# Patient Record
Sex: Male | Born: 1940 | Race: White | Hispanic: No | Marital: Married | Smoking: Former smoker
Health system: Southern US, Community
[De-identification: ages and names within clinical notes are randomized; demographics above are authoritative.]

## PROBLEM LIST (undated history)

## (undated) DIAGNOSIS — I219 Acute myocardial infarction, unspecified: Secondary | ICD-10-CM

## (undated) DIAGNOSIS — I429 Cardiomyopathy, unspecified: Secondary | ICD-10-CM

## (undated) DIAGNOSIS — M1710 Unilateral primary osteoarthritis, unspecified knee: Secondary | ICD-10-CM

## (undated) DIAGNOSIS — E785 Hyperlipidemia, unspecified: Secondary | ICD-10-CM

## (undated) DIAGNOSIS — Z87442 Personal history of urinary calculi: Secondary | ICD-10-CM

## (undated) DIAGNOSIS — I340 Nonrheumatic mitral (valve) insufficiency: Secondary | ICD-10-CM

## (undated) DIAGNOSIS — I2510 Atherosclerotic heart disease of native coronary artery without angina pectoris: Secondary | ICD-10-CM

## (undated) DIAGNOSIS — I10 Essential (primary) hypertension: Secondary | ICD-10-CM

## (undated) DIAGNOSIS — I499 Cardiac arrhythmia, unspecified: Secondary | ICD-10-CM

## (undated) DIAGNOSIS — M1990 Unspecified osteoarthritis, unspecified site: Secondary | ICD-10-CM

## (undated) DIAGNOSIS — R7303 Prediabetes: Secondary | ICD-10-CM

## (undated) DIAGNOSIS — I4891 Unspecified atrial fibrillation: Secondary | ICD-10-CM

## (undated) HISTORY — DX: Atherosclerotic heart disease of native coronary artery without angina pectoris: I25.10

## (undated) HISTORY — PX: APPENDECTOMY: SHX54

## (undated) HISTORY — PX: ROTATOR CUFF REPAIR: SHX139

## (undated) HISTORY — PX: CATARACT EXTRACTION: SUR2

## (undated) HISTORY — PX: EYE SURGERY: SHX253

## (undated) HISTORY — PX: KNEE SURGERY: SHX244

## (undated) HISTORY — PX: CARDIAC CATHETERIZATION: SHX172

## (undated) HISTORY — DX: Nonrheumatic mitral (valve) insufficiency: I34.0

## (undated) HISTORY — PX: JOINT REPLACEMENT: SHX530

## (undated) HISTORY — DX: Hyperlipidemia, unspecified: E78.5

## (undated) HISTORY — PX: CORONARY STENT PLACEMENT: SHX1402

## (undated) HISTORY — DX: Cardiomyopathy, unspecified: I42.9

---

## 2015-06-21 DIAGNOSIS — I2510 Atherosclerotic heart disease of native coronary artery without angina pectoris: Secondary | ICD-10-CM | POA: Insufficient documentation

## 2015-06-21 DIAGNOSIS — E785 Hyperlipidemia, unspecified: Secondary | ICD-10-CM

## 2015-06-21 DIAGNOSIS — I429 Cardiomyopathy, unspecified: Secondary | ICD-10-CM | POA: Insufficient documentation

## 2015-06-21 HISTORY — DX: Atherosclerotic heart disease of native coronary artery without angina pectoris: I25.10

## 2015-06-21 HISTORY — DX: Hyperlipidemia, unspecified: E78.5

## 2016-02-06 DIAGNOSIS — N183 Chronic kidney disease, stage 3 unspecified: Secondary | ICD-10-CM | POA: Insufficient documentation

## 2016-02-06 DIAGNOSIS — I252 Old myocardial infarction: Secondary | ICD-10-CM | POA: Insufficient documentation

## 2016-02-06 DIAGNOSIS — I255 Ischemic cardiomyopathy: Secondary | ICD-10-CM | POA: Insufficient documentation

## 2016-02-06 HISTORY — DX: Old myocardial infarction: I25.2

## 2016-02-06 HISTORY — DX: Chronic kidney disease, stage 3 unspecified: N18.30

## 2016-02-06 HISTORY — DX: Ischemic cardiomyopathy: I25.5

## 2016-04-22 DIAGNOSIS — I10 Essential (primary) hypertension: Secondary | ICD-10-CM

## 2016-04-22 HISTORY — DX: Essential (primary) hypertension: I10

## 2017-01-23 DIAGNOSIS — E119 Type 2 diabetes mellitus without complications: Secondary | ICD-10-CM | POA: Insufficient documentation

## 2017-01-23 HISTORY — DX: Type 2 diabetes mellitus without complications: E11.9

## 2017-05-13 ENCOUNTER — Encounter: Payer: Self-pay | Admitting: Cardiology

## 2017-05-13 ENCOUNTER — Ambulatory Visit (INDEPENDENT_AMBULATORY_CARE_PROVIDER_SITE_OTHER): Payer: Medicare Other | Admitting: Cardiology

## 2017-05-13 VITALS — BP 130/84 | HR 71 | Ht 71.0 in | Wt 196.0 lb

## 2017-05-13 DIAGNOSIS — E785 Hyperlipidemia, unspecified: Secondary | ICD-10-CM | POA: Diagnosis not present

## 2017-05-13 DIAGNOSIS — I10 Essential (primary) hypertension: Secondary | ICD-10-CM

## 2017-05-13 DIAGNOSIS — I252 Old myocardial infarction: Secondary | ICD-10-CM

## 2017-05-13 DIAGNOSIS — Z01810 Encounter for preprocedural cardiovascular examination: Secondary | ICD-10-CM | POA: Insufficient documentation

## 2017-05-13 DIAGNOSIS — Z0000 Encounter for general adult medical examination without abnormal findings: Secondary | ICD-10-CM

## 2017-05-13 DIAGNOSIS — I255 Ischemic cardiomyopathy: Secondary | ICD-10-CM | POA: Diagnosis not present

## 2017-05-13 DIAGNOSIS — I2510 Atherosclerotic heart disease of native coronary artery without angina pectoris: Secondary | ICD-10-CM | POA: Diagnosis not present

## 2017-05-13 HISTORY — DX: Encounter for general adult medical examination without abnormal findings: Z00.00

## 2017-05-13 MED ORDER — PRAVASTATIN SODIUM 40 MG PO TABS: 40.0000 mg | ORAL_TABLET | Freq: Every evening | ORAL | 3 refills | Status: DC

## 2017-05-13 MED ORDER — RAMIPRIL 10 MG PO CAPS: 10.0000 mg | ORAL_CAPSULE | Freq: Every day | ORAL | 3 refills | Status: DC

## 2017-05-13 MED ORDER — CARVEDILOL 6.25 MG PO TABS: 6.2500 mg | ORAL_TABLET | Freq: Two times a day (BID) | ORAL | 3 refills | Status: DC

## 2017-05-13 NOTE — Addendum Note (Signed)
Addended by: Rodney Langton on: 05/13/2017 10:13 AM   Modules accepted: Orders

## 2017-05-13 NOTE — Patient Instructions (Addendum)
Medication Instructions:  Your physician recommends that you continue on your current medications as directed. Please refer to the Current Medication list given to you today.  Labwork: None   Testing/Procedures: EKG today in office.   Follow-Up: Your physician wants you to follow-up in: 6 months.  You will receive a reminder letter in the mail two months in advance. If you don't receive a letter, please call our office to schedule the follow-up appointment.  Any Other Special Instructions Will Be Listed Below (If Applicable).  Please note that any paperwork needing to be filled out by the provider will need to be addressed at the front desk prior to seeing the provider. Please note that any paperwork FMLA, Disability or other documents regarding health condition is subject to a $25.00 charge that must be received prior to completion of paperwork in the form of a money order or check.    If you need a refill on your cardiac medications before your next appointment, please call your pharmacy.

## 2017-05-13 NOTE — Progress Notes (Signed)
Cardiology Office Note:    Date:  05/13/2017   ID:  Steven Dixon, DOB 1941-04-22, MRN [id number]  PCP:  Raynelle Jan., MD  Cardiologist:  Gypsy Balsam, MD    Referring MD: Raynelle Jan., MD   Chief Complaint  Patient presents with  . Follow-up    doing well; no concerns   Doing well talking about potentially having knee replacement surgery  History of Present Illness:    Steven Dixon is a 76 y.o. male doing well denies having chest pain tightness squeezing pressure burning chest but ability to exercise is limited because of knee problem.  He is contemplating knee surgery however he wants to get one more injections before proceeding with surgery.  Denies to have any chest pain tightness but some shortness of breath with exertion I will ask him to have echocardiogram to assess left ventricular ejection fraction.  Past Medical History:  Diagnosis Date  . Cardiomyopathy (HCC)   . Coronary artery disease   . Hyperlipidemia   . MI (mitral incompetence)     Past Surgical History:  Procedure Laterality Date  . CARDIAC CATHETERIZATION    . CATARACT EXTRACTION    . CORONARY STENT PLACEMENT    . KNEE SURGERY    . ROTATOR CUFF REPAIR      Current Medications: Current Meds  Medication Sig  . aspirin EC 81 MG tablet Take 81 mg by mouth daily.  . carvedilol (COREG) 6.25 MG tablet Take 6.25 mg by mouth 2 (two) times daily.  . nitroGLYCERIN (NITROSTAT) 0.4 MG SL tablet Place 0.4 mg under the tongue as needed for chest pain.  . ramipril (ALTACE) 10 MG capsule Take 10 mg by mouth daily.  . ranolazine (RANEXA) 500 MG 12 hr tablet Take 500 mg by mouth 2 (two) times daily.     Allergies:   Patient has no known allergies.   Social History   Socioeconomic History  . Marital status: Married    Spouse name: None  . Number of children: None  . Years of education: None  . Highest education level: None  Social Needs  . Financial resource strain: None  . Food insecurity -  worry: None  . Food insecurity - inability: None  . Transportation needs - medical: None  . Transportation needs - non-medical: None  Occupational History  . None  Tobacco Use  . Smoking status: Former Games developer  . Smokeless tobacco: Never Used  Substance and Sexual Activity  . Alcohol use: No  . Drug use: No  . Sexual activity: None  Other Topics Concern  . None  Social History Narrative  . None     Family History: The patient's family history includes Heart disease in his father and mother; Rheum arthritis in his father. ROS:   Please see the history of present illness.    All 14 point review of systems negative except as described per history of present illness  EKGs/Labs/Other Studies Reviewed:      Recent Labs: No results found for requested labs within last 8760 hours.  Recent Lipid Panel No results found for: CHOL, TRIG, HDL, CHOLHDL, VLDL, LDLCALC, LDLDIRECT  Physical Exam:    VS:  BP 130/84 (BP Location: Right Arm, Patient Position: Sitting, Cuff Size: Normal)   Pulse 71   Ht 5\' 11"  (1.803 m)   Wt 196 lb 0.6 oz (88.9 kg)   BMI 27.34 kg/m     Wt Readings from Last 3 Encounters:  05/13/17 196 lb 0.6  oz (88.9 kg)     GEN:  Well nourished, well developed in no acute distress HEENT: Normal NECK: No JVD; No carotid bruits LYMPHATICS: No lymphadenopathy CARDIAC: RRR, no murmurs, no rubs, no gallops RESPIRATORY:  Clear to auscultation without rales, wheezing or rhonchi  ABDOMEN: Soft, non-tender, non-distended MUSCULOSKELETAL:  No edema; No deformity  SKIN: Warm and dry LOWER EXTREMITIES: no swelling NEUROLOGIC:  Alert and oriented x 3 PSYCHIATRIC:  Normal affect   ASSESSMENT:     PLAN:    In order of problems listed above:  1. 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 Dyslipidemia he had some issues with statin he read a lot about  the bad effect of this medication I convince him to go back on pravastatin also told him ideally he need to be on high intensity statin.  He wants to try pravastatin first 3. Essential hypertension: Blood pressure well controlled. 4. Potential need for knee surgery.  He will let us know if he decides to proceed that way then will do a stress test.   Medication Adjustments/Labs and Tests Ordered: Current medicines are reviewed at length with the patient today.  Concerns regarding medicines are outlined above.  Orders Placed This Encounter  Procedures  . EKG 12-Lead   Medication changes: No orders of the defined types were placed in this encounter.   Signed, Georgeanna Leaobert J. Krasowski, MD, [facility] 05/13/2017 10:05 AM    [facility]

## 2017-07-09 ENCOUNTER — Other Ambulatory Visit: Payer: Self-pay | Admitting: Nurse Practitioner

## 2017-07-09 ENCOUNTER — Telehealth: Payer: Self-pay

## 2017-07-09 DIAGNOSIS — I509 Heart failure, unspecified: Secondary | ICD-10-CM

## 2017-07-09 DIAGNOSIS — Z01810 Encounter for preprocedural cardiovascular examination: Principal | ICD-10-CM

## 2017-07-09 NOTE — Telephone Encounter (Signed)
   Primary Cardiologist:Steven Bing Matter, MD  Chart reviewed as part of pre-operative protocol coverage. Because of [place] past medical history and information in his most recent clinic visit, [name] will require a lexiscan myoview prior to knee surgery.  I will place order for this test and have staff contact patient to schedule.  Pre-op covering staff: - Please schedule appointment and call patient to inform them.  Nicolasa Ducking, NP  07/09/2017, 3:19 PM

## 2017-07-09 NOTE — Telephone Encounter (Signed)
   [facility] Pre-operative Risk Assessment    Request for surgical clearance:  1. What type of surgery is being performed? Right total knee replacement.  2. When is this surgery scheduled? 08/14/2017   3. Are there any medications that need to be held prior to surgery and how long? Please advise as to any special instructions before, during and after surgery for patient.   4. Practice name and name of physician performing surgery? [facility]. Dr. French Ana  5. What is your office phone and fax number? Phone: [phone]. Fax: [phone]. ATTNClaiborne Billings  6. Anesthesia type (None, local, MAC, general) ? Not specified.   Steven Dixon S 07/09/2017, 2:21 PM  _________________________________________________________________   (provider comments below)

## 2017-07-10 ENCOUNTER — Telehealth (HOSPITAL_COMMUNITY): Payer: Self-pay | Admitting: *Deleted

## 2017-07-10 NOTE — Telephone Encounter (Signed)
Left message on voicemail per DPR in reference to upcoming appointment scheduled on 07/14/17 at 10:00 with detailed instructions given per Myocardial Perfusion Study Information Sheet for the test. LM to arrive 15 minutes early, and that it is imperative to arrive on time for appointment to keep from having the test rescheduled. If you need to cancel or reschedule your appointment, please call the office within 24 hours of your appointment. Failure to do so may result in a cancellation of your appointment, and a $50 no show fee. Phone number given for call back for any questions. Sharon S Brooks ° ° ° °

## 2017-07-13 ENCOUNTER — Telehealth: Payer: Self-pay | Admitting: Cardiology

## 2017-07-13 NOTE — Telephone Encounter (Signed)
Patient is having knee surgery and was just seen recently and wants to know if he needs to come in again for clearance.Marland Kitchen

## 2017-07-13 NOTE — Telephone Encounter (Signed)
S/w pt at extreme length regarding his need for a lexiscan for cardiac clearance. Pt had got his appointments confused with pre op stuff and his lexiscan. This has now been clarified with the pt and he is aware that he is to arrive at [address] tomorrow for his lexiscan needed for clearance. He was advised that should this test come back abnormal he would then need to see the provider for further evaluation before surgery. Pt verbalized understanding and is aware of his arrival time for the appointment tomorrow. Instructions regarding the test were went over again as well.

## 2017-07-13 NOTE — Telephone Encounter (Signed)
Please advise 

## 2017-07-14 ENCOUNTER — Ambulatory Visit (HOSPITAL_COMMUNITY): Payer: Medicare Other | Attending: Cardiology

## 2017-07-14 DIAGNOSIS — Z01810 Encounter for preprocedural cardiovascular examination: Secondary | ICD-10-CM | POA: Diagnosis present

## 2017-07-14 DIAGNOSIS — I509 Heart failure, unspecified: Secondary | ICD-10-CM

## 2017-07-14 LAB — MYOCARDIAL PERFUSION IMAGING
CHL CUP NUCLEAR SDS: 0
CHL CUP NUCLEAR SRS: 15
CHL CUP RESTING HR STRESS: 63 {beats}/min
LV dias vol: 126 mL (ref 62–150)
LV sys vol: 74 mL
Peak HR: 81 {beats}/min
RATE: 0.33
SSS: 15
TID: 0.98

## 2017-07-14 MED ORDER — TECHNETIUM TC 99M TETROFOSMIN IV KIT
11.0000 | PACK | Freq: Once | INTRAVENOUS | Status: AC | PRN
  Administered 2017-07-14: 11 via INTRAVENOUS
  Filled 2017-07-14: qty 11

## 2017-07-14 MED ORDER — REGADENOSON 0.4 MG/5ML IV SOLN
0.4000 mg | Freq: Once | INTRAVENOUS | Status: AC
  Administered 2017-07-14: 0.4 mg via INTRAVENOUS

## 2017-07-14 MED ORDER — TECHNETIUM TC 99M TETROFOSMIN IV KIT
32.9000 | PACK | Freq: Once | INTRAVENOUS | Status: AC | PRN
  Administered 2017-07-14: 32.9 via INTRAVENOUS
  Filled 2017-07-14: qty 33

## 2017-07-16 ENCOUNTER — Ambulatory Visit (HOSPITAL_COMMUNITY): Payer: Medicare Other | Attending: Cardiovascular Disease

## 2017-07-16 ENCOUNTER — Other Ambulatory Visit: Payer: Self-pay

## 2017-07-16 DIAGNOSIS — I10 Essential (primary) hypertension: Secondary | ICD-10-CM | POA: Insufficient documentation

## 2017-07-16 DIAGNOSIS — I2510 Atherosclerotic heart disease of native coronary artery without angina pectoris: Secondary | ICD-10-CM | POA: Insufficient documentation

## 2017-07-16 DIAGNOSIS — E785 Hyperlipidemia, unspecified: Secondary | ICD-10-CM | POA: Insufficient documentation

## 2017-07-16 DIAGNOSIS — I255 Ischemic cardiomyopathy: Secondary | ICD-10-CM | POA: Insufficient documentation

## 2017-07-16 LAB — ECHOCARDIOGRAM COMPLETE
Ao-asc: 33 cm
CHL CUP DOP CALC LVOT VTI: 14.8 cm
CHL CUP MV DEC (S): 352
E/e' ratio: 15.61
EWDT: 352 ms
FS: 37 % (ref 28–44)
IVS/LV PW RATIO, ED: 0.69
LA vol A4C: 20.8 ml
LA vol: 22.5 mL
LADIAMINDEX: 1.67 cm/m2
LASIZE: 35 mm
LAVOLIN: 10.8 mL/m2
LDCA: 4.15 cm2
LEFT ATRIUM END SYS DIAM: 35 mm
LV E/e'average: 15.61
LV PW d: 11.6 mm — AB (ref 0.6–1.1)
LV TDI E'LATERAL: 3.26
LVEEMED: 15.61
LVELAT: 3.26 cm/s
LVOT SV: 61 mL
LVOT diameter: 23 mm
LVOTPV: 70.2 cm/s
MV pk A vel: 83.9 m/s
MV pk E vel: 50.9 m/s
RV LATERAL S' VELOCITY: 10.7 cm/s
TAPSE: 21.9 mm
TDI e' medial: 3.15

## 2017-07-22 ENCOUNTER — Ambulatory Visit: Payer: Self-pay | Admitting: Physician Assistant

## 2017-07-22 NOTE — H&P (Signed)
TOTAL KNEE ADMISSION H&P  Patient is being admitted for right total knee arthroplasty.  Subjective:  Chief Complaint:right knee pain.  HPI: Steven Dixon, 77 y.o. male, has a history of pain and functional disability in the right knee due to arthritis and has failed non-surgical conservative treatments for greater than 12 weeks to includeNSAID's and/or analgesics, corticosteriod injections, use of assistive devices and activity modification.  Onset of symptoms was gradual, starting 8 years ago with gradually worsening course since that time. The patient noted no past surgery on the right knee(s).  Patient currently rates pain in the right knee(s) at 10 out of 10 with activity. Patient has night pain, worsening of pain with activity and weight bearing, pain that interferes with activities of daily living, pain with passive range of motion, crepitus and joint swelling.  Patient has evidence of periarticular osteophytes and joint space narrowing by imaging studies.  There is no active infection.  Patient Active Problem List   Diagnosis Date Noted  . Diabetes mellitus type II, controlled (HCC) 01/23/2017  . Essential hypertension 04/22/2016  . CKD (chronic kidney disease), stage III (HCC) 02/06/2016  . Old MI (myocardial infarction) 02/06/2016  . Cardiomyopathy (HCC) 06/21/2015  . Coronary artery disease involving native coronary artery of native heart without angina pectoris 06/21/2015  . Dyslipidemia 06/21/2015   Past Medical History:  Diagnosis Date  . Cardiomyopathy (HCC)   . Coronary artery disease   . Hyperlipidemia   . MI (mitral incompetence)     Past Surgical History:  Procedure Laterality Date  . CARDIAC CATHETERIZATION    . CATARACT EXTRACTION    . CORONARY STENT PLACEMENT    . KNEE SURGERY    . ROTATOR CUFF REPAIR      Current Outpatient Medications  Medication Sig Dispense Refill Last Dose  . aspirin EC 81 MG tablet Take 81 mg by mouth daily.   Taking  . carvedilol  (COREG) 6.25 MG tablet Take 1 tablet (6.25 mg total) 2 (two) times daily by mouth. 180 tablet 3   . nitroGLYCERIN (NITROSTAT) 0.4 MG SL tablet Place 0.4 mg under the tongue as needed for chest pain.   Taking  . pravastatin (PRAVACHOL) 40 MG tablet Take 1 tablet (40 mg total) every evening by mouth. 90 tablet 3   . ramipril (ALTACE) 10 MG capsule Take 1 capsule (10 mg total) daily by mouth. 90 capsule 3   . ranolazine (RANEXA) 500 MG 12 hr tablet Take 500 mg by mouth 2 (two) times daily.   Taking   No current facility-administered medications for this visit.    No Known Allergies  Social History   Tobacco Use  . Smoking status: Former Games developer  . Smokeless tobacco: Never Used  Substance Use Topics  . Alcohol use: No    Family History  Problem Relation Age of Onset  . Heart disease Mother   . Heart disease Father   . Rheum arthritis Father      Review of Systems  Genitourinary: Positive for frequency.  Musculoskeletal: Positive for joint pain.  Endo/Heme/Allergies: Bruises/bleeds easily.  All other systems reviewed and are negative.   Objective:  Physical Exam  Constitutional: He is oriented to person, place, and time. He appears well-developed and well-nourished. No distress.  HENT:  Head: Normocephalic and atraumatic.  Nose: Nose normal.  Eyes: Conjunctivae and EOM are normal. Pupils are equal, round, and reactive to light.  Neck: Normal range of motion. Neck supple.  Cardiovascular: Normal rate, regular rhythm, normal  heart sounds and intact distal pulses.  Respiratory: Breath sounds normal. No respiratory distress. He has no wheezes.  GI: Soft. Bowel sounds are normal. He exhibits no distension. There is no tenderness.  Musculoskeletal:       Right knee: He exhibits decreased range of motion and swelling. Tenderness found.  Lymphadenopathy:    He has no cervical adenopathy.  Neurological: He is alert and oriented to person, place, and time. No cranial nerve deficit.   Skin: Skin is warm and dry. No rash noted. No erythema.  Psychiatric: He has a normal mood and affect. His behavior is normal.    Vital signs in last 24 hours: @VSRANGES @  Labs:   Estimated body mass index is 27.34 kg/m as calculated from the following:   Height as of 07/14/17: 5\' 11"  (1.803 m).   Weight as of 07/14/17: 88.9 kg (196 lb).   Imaging Review Plain radiographs demonstrate moderate degenerative joint disease of the right knee(s). The overall alignment issignificant varus. The bone quality appears to be good for age and reported activity level.  Assessment/Plan:  End stage arthritis, right knee   The patient history, physical examination, clinical judgment of the provider and imaging studies are consistent with end stage degenerative joint disease of the right knee(s) and total knee arthroplasty is deemed medically necessary. The treatment options including medical management, injection therapy arthroscopy and arthroplasty were discussed at length. The risks and benefits of total knee arthroplasty were presented and reviewed. The risks due to aseptic loosening, infection, stiffness, patella tracking problems, thromboembolic complications and other imponderables were discussed. The patient acknowledged the explanation, agreed to proceed with the plan and consent was signed. Patient is being admitted for inpatient treatment for surgery, pain control, PT, OT, prophylactic antibiotics, VTE prophylaxis, progressive ambulation and ADL's and discharge planning. The patient is planning to be discharged home with home health services

## 2017-07-22 NOTE — H&P (View-Only) (Signed)
TOTAL KNEE ADMISSION H&P  Patient is being admitted for right total knee arthroplasty.  Subjective:  Chief Complaint:right knee pain.  HPI: Steven Dixon, 77 y.o. male, has a history of pain and functional disability in the right knee due to arthritis and has failed non-surgical conservative treatments for greater than 12 weeks to includeNSAID's and/or analgesics, corticosteriod injections, use of assistive devices and activity modification.  Onset of symptoms was gradual, starting 8 years ago with gradually worsening course since that time. The patient noted no past surgery on the right knee(s).  Patient currently rates pain in the right knee(s) at 10 out of 10 with activity. Patient has night pain, worsening of pain with activity and weight bearing, pain that interferes with activities of daily living, pain with passive range of motion, crepitus and joint swelling.  Patient has evidence of periarticular osteophytes and joint space narrowing by imaging studies.  There is no active infection.  Patient Active Problem List   Diagnosis Date Noted  . Diabetes mellitus type II, controlled (HCC) 01/23/2017  . Essential hypertension 04/22/2016  . CKD (chronic kidney disease), stage III (HCC) 02/06/2016  . Old MI (myocardial infarction) 02/06/2016  . Cardiomyopathy (HCC) 06/21/2015  . Coronary artery disease involving native coronary artery of native heart without angina pectoris 06/21/2015  . Dyslipidemia 06/21/2015   Past Medical History:  Diagnosis Date  . Cardiomyopathy (HCC)   . Coronary artery disease   . Hyperlipidemia   . MI (mitral incompetence)     Past Surgical History:  Procedure Laterality Date  . CARDIAC CATHETERIZATION    . CATARACT EXTRACTION    . CORONARY STENT PLACEMENT    . KNEE SURGERY    . ROTATOR CUFF REPAIR      Current Outpatient Medications  Medication Sig Dispense Refill Last Dose  . aspirin EC 81 MG tablet Take 81 mg by mouth daily.   Taking  . carvedilol  (COREG) 6.25 MG tablet Take 1 tablet (6.25 mg total) 2 (two) times daily by mouth. 180 tablet 3   . nitroGLYCERIN (NITROSTAT) 0.4 MG SL tablet Place 0.4 mg under the tongue as needed for chest pain.   Taking  . pravastatin (PRAVACHOL) 40 MG tablet Take 1 tablet (40 mg total) every evening by mouth. 90 tablet 3   . ramipril (ALTACE) 10 MG capsule Take 1 capsule (10 mg total) daily by mouth. 90 capsule 3   . ranolazine (RANEXA) 500 MG 12 hr tablet Take 500 mg by mouth 2 (two) times daily.   Taking   No current facility-administered medications for this visit.    No Known Allergies  Social History   Tobacco Use  . Smoking status: Former Games developer  . Smokeless tobacco: Never Used  Substance Use Topics  . Alcohol use: No    Family History  Problem Relation Age of Onset  . Heart disease Mother   . Heart disease Father   . Rheum arthritis Father      Review of Systems  Genitourinary: Positive for frequency.  Musculoskeletal: Positive for joint pain.  Endo/Heme/Allergies: Bruises/bleeds easily.  All other systems reviewed and are negative.   Objective:  Physical Exam  Constitutional: He is oriented to person, place, and time. He appears well-developed and well-nourished. No distress.  HENT:  Head: Normocephalic and atraumatic.  Nose: Nose normal.  Eyes: Conjunctivae and EOM are normal. Pupils are equal, round, and reactive to light.  Neck: Normal range of motion. Neck supple.  Cardiovascular: Normal rate, regular rhythm, normal  heart sounds and intact distal pulses.  Respiratory: Breath sounds normal. No respiratory distress. He has no wheezes.  GI: Soft. Bowel sounds are normal. He exhibits no distension. There is no tenderness.  Musculoskeletal:       Right knee: He exhibits decreased range of motion and swelling. Tenderness found.  Lymphadenopathy:    He has no cervical adenopathy.  Neurological: He is alert and oriented to person, place, and time. No cranial nerve deficit.   Skin: Skin is warm and dry. No rash noted. No erythema.  Psychiatric: He has a normal mood and affect. His behavior is normal.    Vital signs in last 24 hours: @VSRANGES @  Labs:   Estimated body mass index is 27.34 kg/m as calculated from the following:   Height as of 07/14/17: 5\' 11"  (1.803 m).   Weight as of 07/14/17: 88.9 kg (196 lb).   Imaging Review Plain radiographs demonstrate moderate degenerative joint disease of the right knee(s). The overall alignment issignificant varus. The bone quality appears to be good for age and reported activity level.  Assessment/Plan:  End stage arthritis, right knee   The patient history, physical examination, clinical judgment of the provider and imaging studies are consistent with end stage degenerative joint disease of the right knee(s) and total knee arthroplasty is deemed medically necessary. The treatment options including medical management, injection therapy arthroscopy and arthroplasty were discussed at length. The risks and benefits of total knee arthroplasty were presented and reviewed. The risks due to aseptic loosening, infection, stiffness, patella tracking problems, thromboembolic complications and other imponderables were discussed. The patient acknowledged the explanation, agreed to proceed with the plan and consent was signed. Patient is being admitted for inpatient treatment for surgery, pain control, PT, OT, prophylactic antibiotics, VTE prophylaxis, progressive ambulation and ADL's and discharge planning. The patient is planning to be discharged home with home health services

## 2017-07-31 NOTE — Pre-Procedure Instructions (Signed)
Steven Dixon  07/31/2017      [facility] - [place] - [place], [place] - [address] [place] [address] Phone: [phone] Fax: [phone]    Your procedure is scheduled on Fri. Feb. 8  Report to [facility] Admitting at 8:30 A.M.  Call this number if you have problems the morning of surgery:  [phone]   Remember:  Do not eat food or drink liquids after midnight on Thurs. Feb.2   Take these medicines the morning of surgery with A SIP OF WATER : carvedilol (coreg), hydrocodone if needed,nitroglycerine if needed               7 days prior to surgery STOP taking any Aspirin(unless otherwise instructed by your surgeon), Aleve, Naproxen, Ibuprofen, Motrin, Advil, Goody's, BC's, all herbal medications, fish oil, and all vitamins   Do not wear jewelry.  Do not wear lotions, powders, or perfumes, or deodorant.  Do not shave 48 hours prior to surgery.  Men may shave face and neck.  Do not bring valuables to the hospital.  [facility] is not responsible for any belongings or valuables.  Contacts, dentures or bridgework may not be worn into surgery.  Leave your suitcase in the car.  After surgery it may be brought to your room.  For patients admitted to the hospital, discharge time will be determined by your treatment team.  Patients discharged the day of surgery will not be allowed to drive home.    Special instructions:  [place]- Preparing For Surgery  Before surgery, you can play an important role. Because skin is not sterile, your skin needs to be as free of germs as possible. You can reduce the number of germs on your skin by washing with CHG (chlorahexidine gluconate) Soap before surgery.  CHG is an antiseptic cleaner which kills germs and bonds with the skin to continue killing germs even after washing.  Please do not use if you have an allergy to CHG or antibacterial soaps. If your skin becomes reddened/irritated stop using the CHG.   Do not shave (including legs and underarms) for at least 48 hours prior to first CHG shower. It is OK to shave your face.  Please follow these instructions carefully.   1. Shower the NIGHT BEFORE SURGERY and the MORNING OF SURGERY with CHG.   2. If you chose to wash your hair, wash your hair first as usual with your normal shampoo.  3. After you shampoo, rinse your hair and body thoroughly to remove the shampoo.  4. Use CHG as you would any other liquid soap. You can apply CHG directly to the skin and wash gently with a scrungie or a clean washcloth.   5. Apply the CHG Soap to your body ONLY FROM THE NECK DOWN.  Do not use on open wounds or open sores. Avoid contact with your eyes, ears, mouth and genitals (private parts). Wash Face and genitals (private parts)  with your normal soap.  6. Wash thoroughly, paying special attention to the area where your surgery will be performed.  7. Thoroughly rinse your body with warm water from the neck down.  8. DO NOT shower/wash with your normal soap after using and rinsing off the CHG Soap.  9. Pat yourself dry with a CLEAN TOWEL.  10. Wear CLEAN PAJAMAS to bed the night before surgery, wear comfortable clothes the morning of surgery  11. Place CLEAN SHEETS on your bed the  night of your first shower and DO NOT SLEEP WITH PETS.    Day of Surgery: Do not apply any deodorants/lotions. Please wear clean clothes to the hospital/surgery center.      Please read over the following fact sheets that you were given. Coughing and Deep Breathing, MRSA Information and Surgical Site Infection Prevention

## 2017-08-03 ENCOUNTER — Ambulatory Visit (HOSPITAL_COMMUNITY)
Admission: RE | Admit: 2017-08-03 | Discharge: 2017-08-03 | Disposition: A | Discharge status: Home / Self Care | Payer: Medicare Other | Source: Ambulatory Visit | Attending: Physician Assistant | Admitting: Physician Assistant

## 2017-08-03 ENCOUNTER — Encounter (HOSPITAL_COMMUNITY)
Admission: RE | Admit: 2017-08-03 | Discharge: 2017-08-03 | Disposition: A | Discharge status: Home / Self Care | Payer: Medicare Other | Source: Ambulatory Visit | Attending: Orthopedic Surgery | Admitting: Orthopedic Surgery

## 2017-08-03 ENCOUNTER — Other Ambulatory Visit: Payer: Self-pay

## 2017-08-03 ENCOUNTER — Encounter (HOSPITAL_COMMUNITY): Payer: Self-pay

## 2017-08-03 DIAGNOSIS — E785 Hyperlipidemia, unspecified: Secondary | ICD-10-CM | POA: Insufficient documentation

## 2017-08-03 DIAGNOSIS — Z0183 Encounter for blood typing: Secondary | ICD-10-CM | POA: Diagnosis not present

## 2017-08-03 DIAGNOSIS — R7303 Prediabetes: Secondary | ICD-10-CM | POA: Insufficient documentation

## 2017-08-03 DIAGNOSIS — Z01812 Encounter for preprocedural laboratory examination: Secondary | ICD-10-CM | POA: Insufficient documentation

## 2017-08-03 DIAGNOSIS — I2510 Atherosclerotic heart disease of native coronary artery without angina pectoris: Secondary | ICD-10-CM | POA: Diagnosis not present

## 2017-08-03 DIAGNOSIS — I255 Ischemic cardiomyopathy: Secondary | ICD-10-CM | POA: Diagnosis not present

## 2017-08-03 DIAGNOSIS — Z01818 Encounter for other preprocedural examination: Secondary | ICD-10-CM | POA: Insufficient documentation

## 2017-08-03 DIAGNOSIS — Z7982 Long term (current) use of aspirin: Secondary | ICD-10-CM | POA: Diagnosis not present

## 2017-08-03 DIAGNOSIS — Z79899 Other long term (current) drug therapy: Secondary | ICD-10-CM | POA: Insufficient documentation

## 2017-08-03 DIAGNOSIS — M1711 Unilateral primary osteoarthritis, right knee: Secondary | ICD-10-CM

## 2017-08-03 DIAGNOSIS — I10 Essential (primary) hypertension: Secondary | ICD-10-CM | POA: Insufficient documentation

## 2017-08-03 DIAGNOSIS — Z87891 Personal history of nicotine dependence: Secondary | ICD-10-CM | POA: Insufficient documentation

## 2017-08-03 DIAGNOSIS — Z955 Presence of coronary angioplasty implant and graft: Secondary | ICD-10-CM | POA: Diagnosis not present

## 2017-08-03 HISTORY — DX: Prediabetes: R73.03

## 2017-08-03 HISTORY — DX: Unilateral primary osteoarthritis, unspecified knee: M17.10

## 2017-08-03 HISTORY — DX: Unspecified osteoarthritis, unspecified site: M19.90

## 2017-08-03 HISTORY — DX: Acute myocardial infarction, unspecified: I21.9

## 2017-08-03 HISTORY — DX: Essential (primary) hypertension: I10

## 2017-08-03 LAB — URINALYSIS, ROUTINE W REFLEX MICROSCOPIC
Bilirubin Urine: NEGATIVE
Glucose, UA: NEGATIVE mg/dL
Hgb urine dipstick: NEGATIVE
Ketones, ur: NEGATIVE mg/dL
LEUKOCYTES UA: NEGATIVE
NITRITE: NEGATIVE
Protein, ur: NEGATIVE mg/dL
SPECIFIC GRAVITY, URINE: 1.024 (ref 1.005–1.030)
pH: 5 (ref 5.0–8.0)

## 2017-08-03 LAB — COMPREHENSIVE METABOLIC PANEL
ALBUMIN: 4.5 g/dL (ref 3.5–5.0)
ALK PHOS: 57 U/L (ref 38–126)
ALT: 16 U/L — AB (ref 17–63)
ANION GAP: 12 (ref 5–15)
AST: 21 U/L (ref 15–41)
BILIRUBIN TOTAL: 0.8 mg/dL (ref 0.3–1.2)
BUN: 19 mg/dL (ref 6–20)
CALCIUM: 9.6 mg/dL (ref 8.9–10.3)
CO2: 23 mmol/L (ref 22–32)
CREATININE: 1.45 mg/dL — AB (ref 0.61–1.24)
Chloride: 102 mmol/L (ref 101–111)
GFR calc Af Amer: 53 mL/min — ABNORMAL LOW (ref >60)
GFR calc non Af Amer: 46 mL/min — ABNORMAL LOW (ref >60)
GLUCOSE: 144 mg/dL — AB (ref 65–99)
Potassium: 3.9 mmol/L (ref 3.5–5.1)
Sodium: 137 mmol/L (ref 135–145)
TOTAL PROTEIN: 7.6 g/dL (ref 6.5–8.1)

## 2017-08-03 LAB — CBC WITH DIFFERENTIAL/PLATELET
BASOS PCT: 1 %
Basophils Absolute: 0 10*3/uL (ref 0.0–0.1)
Eosinophils Absolute: 0.6 10*3/uL (ref 0.0–0.7)
Eosinophils Relative: 7 %
HCT: 47.4 % (ref 39.0–52.0)
HEMOGLOBIN: 16.2 g/dL (ref 13.0–17.0)
LYMPHS ABS: 1.8 10*3/uL (ref 0.7–4.0)
Lymphocytes Relative: 22 %
MCH: 30.3 pg (ref 26.0–34.0)
MCHC: 34.2 g/dL (ref 30.0–36.0)
MCV: 88.6 fL (ref 78.0–100.0)
MONOS PCT: 7 %
Monocytes Absolute: 0.6 10*3/uL (ref 0.1–1.0)
NEUTROS ABS: 5.3 10*3/uL (ref 1.7–7.7)
NEUTROS PCT: 63 %
Platelets: 303 10*3/uL (ref 150–400)
RBC: 5.35 MIL/uL (ref 4.22–5.81)
RDW: 13.4 % (ref 11.5–15.5)
WBC: 8.2 10*3/uL (ref 4.0–10.5)

## 2017-08-03 LAB — TYPE AND SCREEN
ABO/RH(D): O POS
Antibody Screen: NEGATIVE

## 2017-08-03 LAB — SURGICAL PCR SCREEN
MRSA, PCR: NEGATIVE
STAPHYLOCOCCUS AUREUS: NEGATIVE

## 2017-08-03 LAB — APTT: aPTT: 36 seconds (ref 24–36)

## 2017-08-03 LAB — PROTIME-INR
INR: 0.99
Prothrombin Time: 13 seconds (ref 11.4–15.2)

## 2017-08-03 LAB — ABO/RH: ABO/RH(D): O POS

## 2017-08-03 NOTE — Progress Notes (Signed)
PCP - Dr. Carolyne Fiscal  Cardiologist - Dr. Bing Matter- Clearance Note under Echo results  Chest x-ray - 08/03/17  EKG - 05/13/17 (E)  Stress Test - 07/14/17 (E)  ECHO - 07/16/17 (E)  Cardiac Cath - Can't remember the date, but has Stents x2  Sleep Study - No CPAP - None  LABS- 08/03/17: CBC w/D, CMP, PT, PTT, UA, UC, T/S  HA1C- 05/13/17: 6.9 (CE) Borderline- No meds  Anesthesia- Yes. History and cardiac clearance note  Pt denies having chest pain, sob, or fever at this time. All instructions explained to the pt, with a verbal understanding of the material. Pt agrees to go over the instructions while at home for a better understanding. The opportunity to ask questions was provided.

## 2017-08-04 LAB — URINE CULTURE: CULTURE: NO GROWTH

## 2017-08-04 NOTE — Progress Notes (Signed)
Anesthesia Chart Review:  Pt is a 77 year old male scheduled for R total knee arthroplasty on 08/14/2017 with Frederico Hamman, MD  - PCP is Joetta Manners, MD (notes in care everywhere)  - Cardiologist is Elenora Fender who cleared pt for surgery in comment on echo 07/16/17  PMH includes:  CAD ("stents x2" per pt; can't remember when cath was), ischemic cardiomyopathy, HTN, pre-diabetes, hyperlipidemia. Former smoker. BMI 26.5  Medications include: ASA 81 mg, carvedilol, pravastatin, ramipril  BP (!) 152/74   Pulse 68   Temp 36.8 C   Resp 20   Ht 5\' 11"  (1.803 m)   Wt 190 lb 9.6 oz (86.5 kg)   SpO2 98%   BMI 26.58 kg/m   Preoperative labs reviewed.    CXR 08/03/17: Negative two view chest x-ray  EKG 05/13/17: Sinus bradycardia (59 bpm). Anteroseptal infarct, age undetermined.   Echo 07/16/17:  - Left ventricle: Global longitudinal LV strain is abnormal at-12% The cavity size was mildly dilated. Systolic function was mildly to moderately reduced. The estimated ejection fraction was in the range of 40% to 45%. There is akinesis of the apicalanterior, lateral, inferior, and apical myocardium. There is akinesis of the midanteroseptal myocardium. There was an increased relative contribution of atrial contraction to ventricular filling. Doppler parameters are consistent with abnormal left ventricular relaxation (grade 1 diastolic dysfunction). Doppler parameters are consistent with high ventricular filling pressure. - Aortic valve: Trileaflet; mildly thickened, mildly calcified leaflets. There was trivial regurgitation. - Mitral valve: There was trivial regurgitation. - Pulmonary arteries: Systolic pressure could not be accurately estimated.  Nuclear stress test 07/14/17:   Nuclear stress EF: 41%.  There was no ST segment deviation noted during stress.  Defect 1: There is a medium defect of severe severity present in the mid anterior, apical anterior and apex location.  This is an  intermediate risk study.  The left ventricular ejection fraction is moderately decreased (30-44%).  Findings consistent with prior myocardial infarction. - There is a medium size, severe, non-reversible defect in the mid and apical anterior walls and in the true apex consistent with an infarct in the mid LAD territory with no peri-infarct ischemia.   If no changes, I anticipate pt can proceed with surgery as scheduled.   Rica Mast, FNP-BC [facility]/Anesthesiology Phone: [phone] 08/04/2017 12:41 PM

## 2017-08-06 ENCOUNTER — Other Ambulatory Visit: Payer: Self-pay | Admitting: Orthopedic Surgery

## 2017-08-13 MED ORDER — TRANEXAMIC ACID 1000 MG/10ML IV SOLN
2000.0000 mg | INTRAVENOUS | Status: AC
  Administered 2017-08-14: 2000 mg via TOPICAL
  Filled 2017-08-13: qty 20

## 2017-08-13 MED ORDER — TRANEXAMIC ACID 1000 MG/10ML IV SOLN
1000.0000 mg | INTRAVENOUS | Status: AC
  Administered 2017-08-14: 1000 mg via INTRAVENOUS
  Filled 2017-08-13: qty 1100

## 2017-08-13 MED ORDER — BUPIVACAINE LIPOSOME 1.3 % IJ SUSP
20.0000 mL | INTRAMUSCULAR | Status: AC
  Administered 2017-08-14: 20 mL
  Filled 2017-08-13: qty 20

## 2017-08-14 ENCOUNTER — Inpatient Hospital Stay (HOSPITAL_COMMUNITY): Payer: Medicare Other | Admitting: Emergency Medicine

## 2017-08-14 ENCOUNTER — Other Ambulatory Visit: Payer: Self-pay

## 2017-08-14 ENCOUNTER — Inpatient Hospital Stay (HOSPITAL_COMMUNITY): Payer: Medicare Other | Admitting: Anesthesiology

## 2017-08-14 ENCOUNTER — Encounter (HOSPITAL_COMMUNITY): Payer: Self-pay | Admitting: Anesthesiology

## 2017-08-14 ENCOUNTER — Encounter (HOSPITAL_COMMUNITY)
Admission: RE | Disposition: A | Discharge status: Home Health | Payer: Self-pay | Source: Ambulatory Visit | Attending: Orthopedic Surgery

## 2017-08-14 ENCOUNTER — Inpatient Hospital Stay (HOSPITAL_COMMUNITY)
Admission: RE | Admit: 2017-08-14 | Discharge: 2017-08-16 | DRG: 470 | Disposition: A | Discharge status: Home Health | Payer: Medicare Other | Source: Ambulatory Visit | Attending: Orthopedic Surgery | Admitting: Orthopedic Surgery

## 2017-08-14 DIAGNOSIS — I340 Nonrheumatic mitral (valve) insufficiency: Secondary | ICD-10-CM | POA: Diagnosis present

## 2017-08-14 DIAGNOSIS — N183 Chronic kidney disease, stage 3 (moderate): Secondary | ICD-10-CM | POA: Diagnosis present

## 2017-08-14 DIAGNOSIS — Z7982 Long term (current) use of aspirin: Secondary | ICD-10-CM

## 2017-08-14 DIAGNOSIS — Z955 Presence of coronary angioplasty implant and graft: Secondary | ICD-10-CM | POA: Diagnosis not present

## 2017-08-14 DIAGNOSIS — Z8249 Family history of ischemic heart disease and other diseases of the circulatory system: Secondary | ICD-10-CM

## 2017-08-14 DIAGNOSIS — Z8261 Family history of arthritis: Secondary | ICD-10-CM | POA: Diagnosis not present

## 2017-08-14 DIAGNOSIS — Z79899 Other long term (current) drug therapy: Secondary | ICD-10-CM

## 2017-08-14 DIAGNOSIS — M1711 Unilateral primary osteoarthritis, right knee: Secondary | ICD-10-CM | POA: Diagnosis not present

## 2017-08-14 DIAGNOSIS — Z87891 Personal history of nicotine dependence: Secondary | ICD-10-CM

## 2017-08-14 DIAGNOSIS — I2510 Atherosclerotic heart disease of native coronary artery without angina pectoris: Secondary | ICD-10-CM | POA: Diagnosis present

## 2017-08-14 DIAGNOSIS — E785 Hyperlipidemia, unspecified: Secondary | ICD-10-CM | POA: Diagnosis present

## 2017-08-14 DIAGNOSIS — I252 Old myocardial infarction: Secondary | ICD-10-CM | POA: Diagnosis not present

## 2017-08-14 DIAGNOSIS — I129 Hypertensive chronic kidney disease with stage 1 through stage 4 chronic kidney disease, or unspecified chronic kidney disease: Secondary | ICD-10-CM | POA: Diagnosis present

## 2017-08-14 DIAGNOSIS — E1122 Type 2 diabetes mellitus with diabetic chronic kidney disease: Secondary | ICD-10-CM | POA: Diagnosis present

## 2017-08-14 DIAGNOSIS — M1990 Unspecified osteoarthritis, unspecified site: Secondary | ICD-10-CM | POA: Diagnosis present

## 2017-08-14 DIAGNOSIS — I429 Cardiomyopathy, unspecified: Secondary | ICD-10-CM | POA: Diagnosis present

## 2017-08-14 HISTORY — PX: TOTAL KNEE ARTHROPLASTY: SHX125

## 2017-08-14 HISTORY — DX: Unilateral primary osteoarthritis, right knee: M17.11

## 2017-08-14 SURGERY — ARTHROPLASTY, KNEE, TOTAL
Anesthesia: Monitor Anesthesia Care | Site: Knee | Laterality: Right

## 2017-08-14 MED ORDER — OXYCODONE HCL 5 MG/5ML PO SOLN: 5.0000 mg | Freq: Once | ORAL | Status: DC | PRN

## 2017-08-14 MED ORDER — SODIUM CHLORIDE 0.9 % IV SOLN: INTRAVENOUS | Status: DC

## 2017-08-14 MED ORDER — METOCLOPRAMIDE HCL 5 MG PO TABS: 5.0000 mg | ORAL_TABLET | Freq: Three times a day (TID) | ORAL | Status: DC | PRN

## 2017-08-14 MED ORDER — OXYCODONE HCL 5 MG PO TABS: ORAL_TABLET | ORAL | 0 refills | Status: DC

## 2017-08-14 MED ORDER — CEFAZOLIN SODIUM-DEXTROSE 1-4 GM/50ML-% IV SOLN
1.0000 g | Freq: Four times a day (QID) | INTRAVENOUS | Status: AC
  Administered 2017-08-14 (×2): 1 g via INTRAVENOUS
  Filled 2017-08-14 (×3): qty 50

## 2017-08-14 MED ORDER — BUPIVACAINE IN DEXTROSE 0.75-8.25 % IT SOLN
INTRATHECAL | Status: DC | PRN
  Administered 2017-08-14: 1.8 mL via INTRATHECAL

## 2017-08-14 MED ORDER — METOCLOPRAMIDE HCL 5 MG/ML IJ SOLN: 5.0000 mg | Freq: Three times a day (TID) | INTRAMUSCULAR | Status: DC | PRN

## 2017-08-14 MED ORDER — SODIUM CHLORIDE 0.9 % IV SOLN
INTRAVENOUS | Status: DC
  Administered 2017-08-14 – 2017-08-15 (×2): via INTRAVENOUS

## 2017-08-14 MED ORDER — CHLORHEXIDINE GLUCONATE 4 % EX LIQD: 60.0000 mL | Freq: Once | CUTANEOUS | Status: DC

## 2017-08-14 MED ORDER — PROPOFOL 500 MG/50ML IV EMUL
INTRAVENOUS | Status: AC
  Filled 2017-08-14: qty 50

## 2017-08-14 MED ORDER — DOCUSATE SODIUM 100 MG PO CAPS
100.0000 mg | ORAL_CAPSULE | Freq: Two times a day (BID) | ORAL | Status: DC
  Administered 2017-08-14 – 2017-08-16 (×4): 100 mg via ORAL
  Filled 2017-08-14 (×5): qty 1

## 2017-08-14 MED ORDER — CEFAZOLIN SODIUM-DEXTROSE 2-4 GM/100ML-% IV SOLN
2.0000 g | INTRAVENOUS | Status: AC
  Administered 2017-08-14: 2 g via INTRAVENOUS

## 2017-08-14 MED ORDER — ACETAMINOPHEN 325 MG PO TABS
650.0000 mg | ORAL_TABLET | ORAL | Status: DC | PRN
  Administered 2017-08-15 – 2017-08-16 (×5): 650 mg via ORAL
  Filled 2017-08-14 (×5): qty 2

## 2017-08-14 MED ORDER — MIDAZOLAM HCL 2 MG/2ML IJ SOLN
INTRAMUSCULAR | Status: AC
  Filled 2017-08-14: qty 2

## 2017-08-14 MED ORDER — VITAMIN D 1000 UNITS PO TABS
1000.0000 [IU] | ORAL_TABLET | Freq: Every day | ORAL | Status: DC
  Administered 2017-08-14 – 2017-08-16 (×3): 1000 [IU] via ORAL
  Filled 2017-08-14 (×4): qty 1

## 2017-08-14 MED ORDER — FENTANYL CITRATE (PF) 100 MCG/2ML IJ SOLN
INTRAMUSCULAR | Status: AC
  Administered 2017-08-14: 50 ug via INTRAVENOUS
  Filled 2017-08-14: qty 2

## 2017-08-14 MED ORDER — POLYETHYLENE GLYCOL 3350 17 G PO PACK: 17.0000 g | PACK | Freq: Every day | ORAL | Status: DC | PRN

## 2017-08-14 MED ORDER — ONDANSETRON HCL 4 MG PO TABS: 4.0000 mg | ORAL_TABLET | Freq: Four times a day (QID) | ORAL | Status: DC | PRN

## 2017-08-14 MED ORDER — ACETAMINOPHEN 650 MG RE SUPP: 650.0000 mg | RECTAL | Status: DC | PRN

## 2017-08-14 MED ORDER — FENTANYL CITRATE (PF) 100 MCG/2ML IJ SOLN
INTRAMUSCULAR | Status: DC | PRN
  Administered 2017-08-14: 25 ug via INTRAVENOUS
  Administered 2017-08-14: 50 ug via INTRAVENOUS

## 2017-08-14 MED ORDER — OXYCODONE HCL 5 MG PO TABS: 5.0000 mg | ORAL_TABLET | Freq: Once | ORAL | Status: DC | PRN

## 2017-08-14 MED ORDER — CARVEDILOL 6.25 MG PO TABS
6.2500 mg | ORAL_TABLET | Freq: Two times a day (BID) | ORAL | Status: DC
  Administered 2017-08-14 – 2017-08-16 (×4): 6.25 mg via ORAL
  Filled 2017-08-14 (×4): qty 1

## 2017-08-14 MED ORDER — APIXABAN 2.5 MG PO TABS: 2.5000 mg | ORAL_TABLET | Freq: Two times a day (BID) | ORAL | 0 refills | Status: DC

## 2017-08-14 MED ORDER — OXYCODONE HCL 5 MG PO TABS
10.0000 mg | ORAL_TABLET | ORAL | Status: DC | PRN
  Administered 2017-08-14 – 2017-08-16 (×9): 10 mg via ORAL
  Filled 2017-08-14 (×9): qty 2

## 2017-08-14 MED ORDER — FENTANYL CITRATE (PF) 100 MCG/2ML IJ SOLN: 25.0000 ug | INTRAMUSCULAR | Status: DC | PRN

## 2017-08-14 MED ORDER — 0.9 % SODIUM CHLORIDE (POUR BTL) OPTIME
TOPICAL | Status: DC | PRN
  Administered 2017-08-14: 1000 mL

## 2017-08-14 MED ORDER — PRAVASTATIN SODIUM 40 MG PO TABS
40.0000 mg | ORAL_TABLET | Freq: Every evening | ORAL | Status: DC
  Administered 2017-08-14 – 2017-08-15 (×2): 40 mg via ORAL
  Filled 2017-08-14 (×2): qty 1

## 2017-08-14 MED ORDER — SODIUM CHLORIDE 0.9 % IR SOLN
Status: DC | PRN
  Administered 2017-08-14: 3000 mL

## 2017-08-14 MED ORDER — LACTATED RINGERS IV SOLN
INTRAVENOUS | Status: DC
  Administered 2017-08-14 (×2): via INTRAVENOUS

## 2017-08-14 MED ORDER — ONDANSETRON HCL 4 MG/2ML IJ SOLN: 4.0000 mg | Freq: Four times a day (QID) | INTRAMUSCULAR | Status: DC | PRN

## 2017-08-14 MED ORDER — PROPOFOL 500 MG/50ML IV EMUL
INTRAVENOUS | Status: DC | PRN
  Administered 2017-08-14: 35 ug/kg/min via INTRAVENOUS

## 2017-08-14 MED ORDER — FENTANYL CITRATE (PF) 250 MCG/5ML IJ SOLN
INTRAMUSCULAR | Status: AC
  Filled 2017-08-14: qty 5

## 2017-08-14 MED ORDER — EPHEDRINE SULFATE 50 MG/ML IJ SOLN
INTRAMUSCULAR | Status: DC | PRN
  Administered 2017-08-14: 10 mg via INTRAVENOUS

## 2017-08-14 MED ORDER — FLEET ENEMA 7-19 GM/118ML RE ENEM: 1.0000 | ENEMA | Freq: Once | RECTAL | Status: DC | PRN

## 2017-08-14 MED ORDER — SODIUM CHLORIDE 0.9% FLUSH
INTRAVENOUS | Status: DC | PRN
  Administered 2017-08-14: 50 mL

## 2017-08-14 MED ORDER — SORBITOL 70 % SOLN: 30.0000 mL | Freq: Every day | Status: DC | PRN

## 2017-08-14 MED ORDER — APIXABAN 2.5 MG PO TABS
2.5000 mg | ORAL_TABLET | Freq: Two times a day (BID) | ORAL | Status: DC
  Administered 2017-08-15 – 2017-08-16 (×3): 2.5 mg via ORAL
  Filled 2017-08-14 (×4): qty 1

## 2017-08-14 MED ORDER — HYDROMORPHONE HCL 1 MG/ML IJ SOLN
0.5000 mg | INTRAMUSCULAR | Status: DC | PRN
  Administered 2017-08-15 (×2): 0.5 mg via INTRAVENOUS
  Filled 2017-08-14 (×2): qty 1

## 2017-08-14 MED ORDER — NITROGLYCERIN 0.4 MG SL SUBL: 0.4000 mg | SUBLINGUAL_TABLET | SUBLINGUAL | Status: DC | PRN

## 2017-08-14 MED ORDER — PROPOFOL 1000 MG/100ML IV EMUL
INTRAVENOUS | Status: AC
  Filled 2017-08-14: qty 100

## 2017-08-14 MED ORDER — PHENOL 1.4 % MT LIQD: 1.0000 | OROMUCOSAL | Status: DC | PRN

## 2017-08-14 MED ORDER — BUPIVACAINE-EPINEPHRINE 0.5% -1:200000 IJ SOLN
INTRAMUSCULAR | Status: DC | PRN
  Administered 2017-08-14: 50 mL

## 2017-08-14 MED ORDER — MENTHOL 3 MG MT LOZG: 1.0000 | LOZENGE | OROMUCOSAL | Status: DC | PRN

## 2017-08-14 MED ORDER — FENTANYL CITRATE (PF) 100 MCG/2ML IJ SOLN
50.0000 ug | Freq: Once | INTRAMUSCULAR | Status: AC
  Administered 2017-08-14: 50 ug via INTRAVENOUS

## 2017-08-14 MED ORDER — RAMIPRIL 10 MG PO CAPS
10.0000 mg | ORAL_CAPSULE | Freq: Every day | ORAL | Status: DC
  Administered 2017-08-14 – 2017-08-16 (×3): 10 mg via ORAL
  Filled 2017-08-14 (×3): qty 1

## 2017-08-14 MED ORDER — BUPIVACAINE-EPINEPHRINE (PF) 0.5% -1:200000 IJ SOLN
INTRAMUSCULAR | Status: AC
  Filled 2017-08-14: qty 60

## 2017-08-14 MED ORDER — ONDANSETRON HCL 4 MG/2ML IJ SOLN
INTRAMUSCULAR | Status: DC | PRN
  Administered 2017-08-14: 4 mg via INTRAVENOUS

## 2017-08-14 MED ORDER — TRANEXAMIC ACID 1000 MG/10ML IV SOLN
1000.0000 mg | Freq: Once | INTRAVENOUS | Status: AC
  Administered 2017-08-14: 1000 mg via INTRAVENOUS
  Filled 2017-08-14: qty 10

## 2017-08-14 MED ORDER — DIPHENHYDRAMINE HCL 12.5 MG/5ML PO ELIX: 12.5000 mg | ORAL_SOLUTION | ORAL | Status: DC | PRN

## 2017-08-14 MED ORDER — OXYCODONE HCL 5 MG PO TABS: 5.0000 mg | ORAL_TABLET | ORAL | Status: DC | PRN

## 2017-08-14 MED ORDER — CEFAZOLIN SODIUM-DEXTROSE 2-4 GM/100ML-% IV SOLN
INTRAVENOUS | Status: AC
  Filled 2017-08-14: qty 100

## 2017-08-14 MED ORDER — LIDOCAINE HCL (CARDIAC) 20 MG/ML IV SOLN
INTRAVENOUS | Status: DC | PRN
  Administered 2017-08-14: 40 mg via INTRATRACHEAL

## 2017-08-14 MED ORDER — ROPIVACAINE HCL 5 MG/ML IJ SOLN
INTRAMUSCULAR | Status: DC | PRN
  Administered 2017-08-14: 20 mL via PERINEURAL

## 2017-08-14 SURGICAL SUPPLY — 61 items
BANDAGE ACE 4X5 VEL STRL LF (GAUZE/BANDAGES/DRESSINGS) IMPLANT
BANDAGE ACE 6X5 VEL STRL LF (GAUZE/BANDAGES/DRESSINGS) ×3 IMPLANT
BANDAGE ELASTIC 4 VELCRO ST LF (GAUZE/BANDAGES/DRESSINGS) ×3 IMPLANT
BANDAGE ESMARK 6X9 LF (GAUZE/BANDAGES/DRESSINGS) ×1 IMPLANT
BLADE SAGITTAL 25.0X1.19X90 (BLADE) ×2 IMPLANT
BLADE SAGITTAL 25.0X1.19X90MM (BLADE) ×1
BLADE SAW SAG 90X13X1.27 (BLADE) ×3 IMPLANT
BNDG ESMARK 6X9 LF (GAUZE/BANDAGES/DRESSINGS) ×3
BOWL SMART MIX CTS (DISPOSABLE) ×3 IMPLANT
CAP KNEE TOTAL 3 SIGMA ×3 IMPLANT
COVER SURGICAL LIGHT HANDLE (MISCELLANEOUS) ×3 IMPLANT
CUFF TOURNIQUET SINGLE 34IN LL (TOURNIQUET CUFF) ×3 IMPLANT
CUFF TOURNIQUET SINGLE 44IN (TOURNIQUET CUFF) IMPLANT
DRAPE INCISE IOBAN 66X45 STRL (DRAPES) IMPLANT
DRAPE ORTHO SPLIT 77X108 STRL (DRAPES) ×4
DRAPE SURG ORHT 6 SPLT 77X108 (DRAPES) ×2 IMPLANT
DRAPE U-SHAPE 47X51 STRL (DRAPES) ×3 IMPLANT
DRSG ADAPTIC 3X8 NADH LF (GAUZE/BANDAGES/DRESSINGS) ×3 IMPLANT
DRSG PAD ABDOMINAL 8X10 ST (GAUZE/BANDAGES/DRESSINGS) ×6 IMPLANT
DURAPREP 26ML APPLICATOR (WOUND CARE) ×6 IMPLANT
ELECT REM PT RETURN 9FT ADLT (ELECTROSURGICAL) ×3
ELECTRODE REM PT RTRN 9FT ADLT (ELECTROSURGICAL) ×1 IMPLANT
EVACUATOR 1/8 PVC DRAIN (DRAIN) IMPLANT
FACESHIELD WRAPAROUND (MASK) ×3 IMPLANT
GAUZE SPONGE 4X4 12PLY STRL (GAUZE/BANDAGES/DRESSINGS) ×3 IMPLANT
GLOVE BIOGEL PI IND STRL 8 (GLOVE) ×4 IMPLANT
GLOVE BIOGEL PI INDICATOR 8 (GLOVE) ×8
GLOVE ORTHO TXT STRL SZ7.5 (GLOVE) ×3 IMPLANT
GLOVE SURG ORTHO 8.0 STRL STRW (GLOVE) ×3 IMPLANT
GOWN STRL REUS W/ TWL LRG LVL3 (GOWN DISPOSABLE) ×1 IMPLANT
GOWN STRL REUS W/ TWL XL LVL3 (GOWN DISPOSABLE) ×1 IMPLANT
GOWN STRL REUS W/TWL 2XL LVL3 (GOWN DISPOSABLE) ×3 IMPLANT
GOWN STRL REUS W/TWL LRG LVL3 (GOWN DISPOSABLE) ×2
GOWN STRL REUS W/TWL XL LVL3 (GOWN DISPOSABLE) ×2
HANDPIECE INTERPULSE COAX TIP (DISPOSABLE) ×2
HOOD PEEL AWAY FACE SHEILD DIS (HOOD) ×3 IMPLANT
IMMOBILIZER KNEE 22 UNIV (SOFTGOODS) ×3 IMPLANT
KIT BASIN OR (CUSTOM PROCEDURE TRAY) ×3 IMPLANT
KIT ROOM TURNOVER OR (KITS) ×3 IMPLANT
MANIFOLD NEPTUNE II (INSTRUMENTS) ×3 IMPLANT
NEEDLE 22X1 1/2 (OR ONLY) (NEEDLE) ×6 IMPLANT
NS IRRIG 1000ML POUR BTL (IV SOLUTION) ×3 IMPLANT
PACK TOTAL JOINT (CUSTOM PROCEDURE TRAY) ×3 IMPLANT
PAD ARMBOARD 7.5X6 YLW CONV (MISCELLANEOUS) ×6 IMPLANT
PAD CAST 4YDX4 CTTN HI CHSV (CAST SUPPLIES) ×1 IMPLANT
PADDING CAST COTTON 4X4 STRL (CAST SUPPLIES) ×2
PADDING CAST COTTON 6X4 STRL (CAST SUPPLIES) ×3 IMPLANT
SET HNDPC FAN SPRY TIP SCT (DISPOSABLE) ×1 IMPLANT
STAPLER VISISTAT 35W (STAPLE) ×3 IMPLANT
SUCTION FRAZIER HANDLE 10FR (MISCELLANEOUS) ×2
SUCTION TUBE FRAZIER 10FR DISP (MISCELLANEOUS) ×1 IMPLANT
SUT ETHIBOND NAB CT1 #1 30IN (SUTURE) ×9 IMPLANT
SUT VIC AB 0 CT1 27 (SUTURE) ×2
SUT VIC AB 0 CT1 27XBRD ANBCTR (SUTURE) ×1 IMPLANT
SUT VIC AB 2-0 CT1 27 (SUTURE) ×4
SUT VIC AB 2-0 CT1 TAPERPNT 27 (SUTURE) ×2 IMPLANT
SYR CONTROL 10ML LL (SYRINGE) ×6 IMPLANT
TOWEL GREEN STERILE (TOWEL DISPOSABLE) ×3 IMPLANT
TRAY CATH 16FR W/PLASTIC CATH (SET/KITS/TRAYS/PACK) ×3 IMPLANT
TRAY FOLEY W/METER SILVER 16FR (SET/KITS/TRAYS/PACK) IMPLANT
WATER STERILE IRR 1000ML POUR (IV SOLUTION) ×3 IMPLANT

## 2017-08-14 NOTE — Brief Op Note (Signed)
08/14/2017  12:37 PM  PATIENT:  Steven Dixon  77 y.o. male  PRE-OPERATIVE DIAGNOSIS:  OA RIGHT KNEE  POST-OPERATIVE DIAGNOSIS:  OA RIGHT KNEE  PROCEDURE:  Procedure(s): RIGHT TOTAL KNEE ARTHROPLASTY (Right)  SURGEON:  Surgeon(s) and Role:    Frederico Hamman, MD - Primary  PHYSICIAN ASSISTANT: Margart Sickles, PA-C  ASSISTANTS:   ANESTHESIA:   local, spinal and IV sedation  EBL:  100 mL   BLOOD ADMINISTERED:none  DRAINS: none   LOCAL MEDICATIONS USED:  MARCAINE     SPECIMEN:  No Specimen  DISPOSITION OF SPECIMEN:  N/A  COUNTS:  YES  TOURNIQUET:   Total Tourniquet Time Documented: area (laterality) - 64 minutes Total: area (laterality) - 64 minutes   DICTATION: .Other Dictation: Dictation Number unknown  PLAN OF CARE: Admit to inpatient   PATIENT DISPOSITION:  PACU - hemodynamically stable.   Delay start of Pharmacological VTE agent (>24hrs) due to surgical blood loss or risk of bleeding: yes

## 2017-08-14 NOTE — Care Management Note (Signed)
Case Management Note  Patient Details  Name: ARISTEDE BUCHMANN MRN: [id number] Date of Birth: 10/01/1940  Subjective/Objective:     Right TKA               Action/Plan: Spoke to pt and wife at bedside. Offered choice for HH/list provided. Agreeable to [facility] for [facility]. Has CPM, RW and 3n1 at home from [employer].    Expected Discharge Date:                  Expected Discharge Plan:  Home w Home Health Services  In-House Referral:  NA  Discharge planning Services  CM Consult  Post Acute Care Choice:  Home Health Choice offered to:  Patient  DME Arranged:  3-N-1, Walker rolling, CPM DME Agency:  [employer]  HH Arranged:  PT HH Agency:  [facility] (formerly [facility])  Status of Service:  Completed, signed off  If discussed at [employer], dates discussed:    Additional Comments:  Elliot Cousin, RN 08/14/2017, 5:47 PM

## 2017-08-14 NOTE — Anesthesia Procedure Notes (Signed)
Spinal  Patient location during procedure: OR Start time: 08/14/2017 10:35 AM End time: 08/14/2017 10:38 AM Staffing Anesthesiologist: Achille Rich, MD Performed: anesthesiologist  Preanesthetic Checklist Completed: patient identified, surgical consent, pre-op evaluation, timeout performed, IV checked, risks and benefits discussed and monitors and equipment checked Spinal Block Patient position: sitting Prep: DuraPrep Patient monitoring: cardiac monitor, continuous pulse ox and blood pressure Approach: midline Location: L3-4 Injection technique: single-shot Needle Needle type: Pencan  Needle gauge: 24 G Needle length: 9 cm Assessment Sensory level: T10 Additional Notes Functioning IV was confirmed and monitors were applied. Sterile prep and drape, including hand hygiene and sterile gloves were used. The patient was positioned and the spine was prepped. The skin was anesthetized with lidocaine.  Free flow of clear CSF was obtained prior to injecting local anesthetic into the CSF.  The spinal needle aspirated freely following injection.  The needle was carefully withdrawn.  The patient tolerated the procedure well.

## 2017-08-14 NOTE — Anesthesia Preprocedure Evaluation (Signed)
Anesthesia Evaluation  Patient identified by MRN, date of birth, ID band Patient awake    Reviewed: Allergy & Precautions, H&P , NPO status , Patient's Chart, lab work & pertinent test results  Airway Mallampati: II   Neck ROM: full    Dental   Pulmonary former smoker,    breath sounds clear to auscultation       Cardiovascular hypertension, + CAD, + Past MI and + Cardiac Stents   Rhythm:regular Rate:Normal  Stress test (2019): perfusion defect from old infarct.  No ischemia.  EF 40-45%   Neuro/Psych    GI/Hepatic   Endo/Other  diabetes, Type 2  Renal/GU Renal InsufficiencyRenal disease     Musculoskeletal  (+) Arthritis ,   Abdominal   Peds  Hematology   Anesthesia Other Findings   Reproductive/Obstetrics                             Anesthesia Physical Anesthesia Plan  ASA: III  Anesthesia Plan: MAC and Spinal   Post-op Pain Management:  Regional for Post-op pain   Induction: Intravenous  PONV Risk Score and Plan: 1 and Ondansetron, Treatment may vary due to age or medical condition and Propofol infusion  Airway Management Planned: Simple Face Mask  Additional Equipment:   Intra-op Plan:   Post-operative Plan:   Informed Consent: I have reviewed the patients History and Physical, chart, labs and discussed the procedure including the risks, benefits and alternatives for the proposed anesthesia with the patient or authorized representative who has indicated his/her understanding and acceptance.     Plan Discussed with: CRNA, Anesthesiologist and Surgeon  Anesthesia Plan Comments:         Anesthesia Quick Evaluation

## 2017-08-14 NOTE — Anesthesia Procedure Notes (Addendum)
Anesthesia Regional Block: Adductor canal block   Pre-Anesthetic Checklist: ,, timeout performed, Correct Patient, Correct Site, Correct Laterality, Correct Procedure, Correct Position, site marked, Risks and benefits discussed,  Surgical consent,  Pre-op evaluation,  At surgeon's request and post-op pain management  Laterality: Right  Prep: chloraprep       Needles:  Injection technique: Single-shot  Needle Type: Echogenic Needle     Needle Length: 9cm  Needle Gauge: 21     Additional Needles:   Narrative:  Start time: 08/14/2017 9:30 AM End time: 08/14/2017 9:37 AM Injection made incrementally with aspirations every 5 mL.  Performed by: Personally  Anesthesiologist: Achille Rich, MD  Additional Notes: Pt tolerated the procedure well.

## 2017-08-14 NOTE — Transfer of Care (Signed)
Immediate Anesthesia Transfer of Care Note  Patient: Steven Dixon  Procedure(s) Performed: RIGHT TOTAL KNEE ARTHROPLASTY (Right Knee)  Patient Location: PACU  Anesthesia Type:Regional and Spinal  Level of Consciousness: awake, alert , oriented and sedated  Airway & Oxygen Therapy: Patient Spontanous Breathing and Patient connected to nasal cannula oxygen  Post-op Assessment: Report given to RN, Post -op Vital signs reviewed and stable and Patient moving all extremities  Post vital signs: Reviewed and stable  Last Vitals:  Vitals:   08/14/17 0935 08/14/17 0940  BP: (!) 152/72 (!) 149/69  Pulse: (!) 58 (!) 58  Resp: (!) 7 14  Temp:    SpO2: 97% 94%    Last Pain:  Vitals:   08/14/17 0940  TempSrc:   PainSc: 2       Patients Stated Pain Goal: 3 (08/14/17 0855)  Complications: No apparent anesthesia complications

## 2017-08-14 NOTE — Interval H&P Note (Signed)
History and Physical Interval Note:  08/14/2017 9:26 AM  Steven Dixon  has presented today for surgery, with the diagnosis of OA RIGHT KNEE  The various methods of treatment have been discussed with the patient and family. After consideration of risks, benefits and other options for treatment, the patient has consented to  Procedure(s): RIGHT TOTAL KNEE ARTHROPLASTY (Right) as a surgical intervention .  The patient's history has been reviewed, patient examined, no change in status, stable for surgery.  I have reviewed the patient's chart and labs.  Questions were answered to the patient's satisfaction.     Thera Flake

## 2017-08-14 NOTE — Progress Notes (Signed)
Orthopedic Tech Progress Note Patient Details:  Steven Dixon 08/09/1940 [id number]  CPM Right Knee CPM Right Knee: On Right Knee Flexion (Degrees): 90 Right Knee Extension (Degrees): 0 Additional Comments: Right knee  Post Interventions Patient Tolerated: Well Instructions Provided: Adjustment of device, Care of device  Alvina Chou 08/14/2017, 1:26 PM

## 2017-08-14 NOTE — Evaluation (Signed)
Physical Therapy Evaluation Patient Details Name: Steven Dixon MRN: [id number] DOB: 1940/08/14 Today's Date: 08/14/2017   History of Present Illness  Pt is a 77 y/o male s/p elective R TKA. PMH includes MI, cardiomyopathy, HTN, CAD s/p stent placement, DM, and CKD III.   Clinical Impression  Pt s/p surgery above with deficits below. Pt with increased abdominal pain which limited mobility to within the room. Notified RN and RN to address. Required min guard assist for mobility. Initiated supine HEP. Will continue to follow acutely to maximize functional mobility independence and safety.     Follow Up Recommendations DC plan and follow up therapy as arranged by surgeon;Supervision for mobility/OOB    Equipment Recommendations  None recommended by PT    Recommendations for Other Services       Precautions / Restrictions Precautions Precautions: Knee Precaution Booklet Issued: Yes (comment) Precaution Comments: REviewed knee precautions and supine ther ex.  Required Braces or Orthoses: Knee Immobilizer - Right Knee Immobilizer - Right: Other (comment)(until discontinued ) Restrictions Weight Bearing Restrictions: Yes RLE Weight Bearing: Weight bearing as tolerated      Mobility  Bed Mobility Overal bed mobility: Needs Assistance Bed Mobility: Supine to Sit     Supine to sit: Mod assist     General bed mobility comments: Mod A for trunk elevation and for RLE assist.   Transfers Overall transfer level: Needs assistance Equipment used: Rolling walker (2 wheeled) Transfers: Sit to/from Stand Sit to Stand: Min guard         General transfer comment: Min guard for safety. Verbal cues for safe hand placement.   Ambulation/Gait Ambulation/Gait assistance: Min guard Ambulation Distance (Feet): 20 Feet Assistive device: Rolling walker (2 wheeled) Gait Pattern/deviations: Step-to pattern;Decreased step length - left;Decreased step length - right;Decreased weight shift to  right;Antalgic Gait velocity: Decreased  Gait velocity interpretation: Below normal speed for age/gender General Gait Details: Slow, antalgic gait. Distance limited to bathroom and back secondary to abdominal pain. Notified RN. Verbal cues for sequencing using RW.   Stairs            Wheelchair Mobility    Modified Rankin (Stroke Patients Only)       Balance Overall balance assessment: Needs assistance Sitting-balance support: No upper extremity supported;Feet supported Sitting balance-Leahy Scale: Good     Standing balance support: Bilateral upper extremity supported;During functional activity Standing balance-Leahy Scale: Poor Standing balance comment: Reliant on BUE support.                              Pertinent Vitals/Pain Pain Assessment: 0-10 Pain Score: 5  Pain Location: R knee; abdomen Pain Descriptors / Indicators: Aching;Operative site guarding Pain Intervention(s): Limited activity within patient's tolerance;Monitored during session;Repositioned    Home Living Family/patient expects to be discharged to:: Private residence Living Arrangements: Spouse/significant other Available Help at Discharge: Family;Available 24 hours/day Type of Home: House Home Access: Stairs to enter Entrance Stairs-Rails: Right Entrance Stairs-Number of Steps: 2 Home Layout: One level Home Equipment: Walker - 2 wheels;Bedside commode;Shower seat      Prior Function Level of Independence: Independent with assistive device(s)         Comments: Used cane for ambulation.      Hand Dominance        Extremity/Trunk Assessment   Upper Extremity Assessment Upper Extremity Assessment: Defer to OT evaluation    Lower Extremity Assessment Lower Extremity Assessment: RLE deficits/detail RLE Deficits /  Details: Sensory in tact. Deficits consistent with post op pain and weakness. Able to perform ther ex below.     Cervical / Trunk Assessment Cervical / Trunk  Assessment: Normal  Communication   Communication: No difficulties  Cognition Arousal/Alertness: Awake/alert Behavior During Therapy: WFL for tasks assessed/performed Overall Cognitive Status: Within Functional Limits for tasks assessed                                        General Comments General comments (skin integrity, edema, etc.): Pt's wife present during session. Notified RN at end of session about increased abdominal discomfort and that pt only able to void small amount.     Exercises Total Joint Exercises Ankle Circles/Pumps: AROM;Both;20 reps Quad Sets: AROM;Right;10 reps Towel Squeeze: AROM;Both;10 reps   Assessment/Plan    PT Assessment Patient needs continued PT services  PT Problem List Decreased strength;Decreased balance;Decreased mobility;Decreased activity tolerance;Decreased knowledge of use of DME;Decreased knowledge of precautions;Pain       PT Treatment Interventions DME instruction;Stair training;Gait training;Functional mobility training;Therapeutic activities;Therapeutic exercise;Balance training;Neuromuscular re-education;Patient/family education    PT Goals (Current goals can be found in the Care Plan section)  Acute Rehab PT Goals Patient Stated Goal: to feel better  PT Goal Formulation: With patient Time For Goal Achievement: 08/21/17 Potential to Achieve Goals: Good    Frequency 7X/week   Barriers to discharge        Co-evaluation               AM-PAC PT "6 Clicks" Daily Activity  Outcome Measure Difficulty turning over in bed (including adjusting bedclothes, sheets and blankets)?: A Little Difficulty moving from lying on back to sitting on the side of the bed? : Unable Difficulty sitting down on and standing up from a chair with arms (e.g., wheelchair, bedside commode, etc,.)?: Unable Help needed moving to and from a bed to chair (including a wheelchair)?: A Little Help needed walking in hospital room?: A  Little Help needed climbing 3-5 steps with a railing? : A Little 6 Click Score: 14    End of Session Equipment Utilized During Treatment: Gait belt;Right knee immobilizer Activity Tolerance: Patient limited by pain Patient left: in chair;with call bell/phone within reach;with family/visitor present Nurse Communication: Mobility status;Other (comment)(increased abdominal pain ) PT Visit Diagnosis: Other abnormalities of gait and mobility (R26.89);Pain Pain - Right/Left: Right Pain - part of body: Knee(abdomen )    Time: 6384-5364 PT Time Calculation (min) (ACUTE ONLY): 38 min   Charges:   PT Evaluation $PT Eval Low Complexity: 1 Low PT Treatments $Therapeutic Activity: 8-22 mins   PT G Codes:        Gladys Damme, PT, DPT  Acute Rehabilitation Services  Pager: [phone]   Lehman Prom 08/14/2017, 5:53 PM

## 2017-08-15 LAB — CBC

## 2017-08-15 LAB — BASIC METABOLIC PANEL
Anion gap: 9 (ref 5–15)
BUN: 17 mg/dL (ref 6–20)
CO2: 27 mmol/L (ref 22–32)
CREATININE: 1.49 mg/dL — AB (ref 0.61–1.24)
Calcium: 8.4 mg/dL — ABNORMAL LOW (ref 8.9–10.3)
Chloride: 99 mmol/L — ABNORMAL LOW (ref 101–111)
GFR calc Af Amer: 51 mL/min — ABNORMAL LOW (ref >60)
GFR, EST NON AFRICAN AMERICAN: 44 mL/min — AB (ref >60)
GLUCOSE: 181 mg/dL — AB (ref 65–99)
POTASSIUM: 4 mmol/L (ref 3.5–5.1)
SODIUM: 135 mmol/L (ref 135–145)

## 2017-08-15 LAB — CBC WITH DIFFERENTIAL/PLATELET
Basophils Absolute: 0 10*3/uL (ref 0.0–0.1)
Basophils Relative: 0 %
EOS ABS: 0.2 10*3/uL (ref 0.0–0.7)
EOS PCT: 2 %
HCT: 38.2 % — ABNORMAL LOW (ref 39.0–52.0)
Hemoglobin: 12.6 g/dL — ABNORMAL LOW (ref 13.0–17.0)
LYMPHS ABS: 1.2 10*3/uL (ref 0.7–4.0)
LYMPHS PCT: 11 %
MCH: 29.4 pg (ref 26.0–34.0)
MCHC: 33 g/dL (ref 30.0–36.0)
MCV: 89.3 fL (ref 78.0–100.0)
MONO ABS: 1.7 10*3/uL — AB (ref 0.1–1.0)
MONOS PCT: 15 %
Neutro Abs: 7.8 10*3/uL — ABNORMAL HIGH (ref 1.7–7.7)
Neutrophils Relative %: 72 %
PLATELETS: 239 10*3/uL (ref 150–400)
RBC: 4.28 MIL/uL (ref 4.22–5.81)
RDW: 13.1 % (ref 11.5–15.5)
WBC: 10.9 10*3/uL — AB (ref 4.0–10.5)

## 2017-08-15 NOTE — Progress Notes (Signed)
Orthopedic Tech Progress Note Patient Details:  Steven Dixon 1940/10/27 [id number]  CPM Right Knee CPM Right Knee: On Right Knee Flexion (Degrees): 90 Right Knee Extension (Degrees): 0 Additional Comments: Right knee  Post Interventions Patient Tolerated: Well Instructions Provided: Adjustment of device, Care of device  Saul Fordyce 08/15/2017, 6:23 PM

## 2017-08-15 NOTE — Op Note (Signed)
NAMEREZA, GRZESIK NO.:  [id number]  MEDICAL RECORD NO.:  [id number]  LOCATION:  5N01C                        FACILITY:  [facility]  PHYSICIAN:  Dyke Brackett, M.D.    DATE OF BIRTH:  31-May-1941  DATE OF PROCEDURE:  08/14/2017 DATE OF DISCHARGE:                              OPERATIVE REPORT   PREOPERATIVE DIAGNOSIS:  Severe osteoarthritis, right knee with varus deformity.  POSTOPERATIVE DIAGNOSIS:  Severe osteoarthritis, right knee with varus deformity.  OPERATION:  Right total knee replacement (Sigma cemented knee size 4 femur, tibia 10 mm bearing with 38 mm all-poly patella.  SURGEON:  Dyke Brackett, M.D.  ASSISTANT:  Margart Sickles, PA-C.  ANESTHESIA:  Spinal anesthetic with additional block.  TOURNIQUET TIME:  63 minutes.  DESCRIPTION OF PROCEDURE:  Sterile prep and drape, supine position, exsanguination of leg, inflation to 350.  A straight skin incision was made with medial parapatellar approach to the knee made.  We did an 11 mm 5-degree valgus cut on the distal femur followed by cutting about 4 mm below the most diseased medial compartment.  Moderate stripping of the medial side was done secondary to the varus deformity.  Extension gap was measured at 10 mm.  The femur was sized to be a size 4, placed all-in-1 cutting block in appropriate degree of external rotation, and cut the anterior, posterior, and chamber cuts.  Flexion gap equaled the extension gap at 10 mm.  PCL and excess menisci and posterior osteophytes were removed from the posterior aspect of the knee as well. Tibia was sized to be a size 4.  We placed the keel trial and then the box cut for the femur.  Full extension was noted with the trials in. The patella was cut leaving about 17 mm of native patella with a 38 mm all-poly trial.  Again, preoperatively noted to have a flexion contracture and varus deformity with good alignment noted with resolution of the flexion contracture with  the trials in place.  Cement was applied on the back table in a doughy state.  We infiltrated the subcutaneous tissues and capsular structures of the knee with a mixture of Exparel and Marcaine with epinephrine.  The prosthesis was inserted with the tibia followed by the femoral with the trial bearing inserted in the patella.  Cement was allowed to harden.  Excess cement was removed from the posterior aspect of the knee with the trial bearing was removed.  Tourniquet was released with the bearing out.  No excessive bleeding was noted.  Small bleeders were coagulated.  Final bearing was placed.  All parameters deemed to be acceptable.  Closure was effected on the capsule with #1 Ethibond with subcutaneous tissues with 2-0 Vicryl and the skin with a stapling device.  Lightly compressive sterile dressing and knee immobilizer were applied and taken to the recovery room in stable condition.     Dyke Brackett, M.D.     WDC/MEDQ  D:  08/14/2017  T:  08/15/2017  Job:  [id number]

## 2017-08-15 NOTE — Evaluation (Signed)
Occupational Therapy Evaluation and Discharge Patient Details Name: Steven Dixon MRN: [id number] DOB: Mar 11, 1941 Today's Date: 08/15/2017    History of Present Illness Pt is a 77 y/o male s/p elective R TKA. PMH includes MI, cardiomyopathy, HTN, CAD s/p stent placement, DM, and CKD III.    Clinical Impression   All education completed as detailed below. Pt will have wife available to assist with ADL and has all necessary DME at home. No further OT needs.    Follow Up Recommendations  No OT follow up    Equipment Recommendations  None recommended by OT    Recommendations for Other Services       Precautions / Restrictions Precautions Precautions: Knee;Fall Precaution Booklet Issued: Yes (comment) Precaution Comments: educated in no pillow under knee Required Braces or Orthoses: Knee Immobilizer - Right Knee Immobilizer - Right: Other (comment) Restrictions Weight Bearing Restrictions: Yes RLE Weight Bearing: Weight bearing as tolerated      Mobility Bed Mobility Overal bed mobility: Needs Assistance Bed Mobility: Supine to Sit;Sit to Supine     Supine to sit: Min assist Sit to supine: Min assist   General bed mobility comments: assist for R LE  Transfers Overall transfer level: Needs assistance Equipment used: Rolling walker (2 wheeled) Transfers: Sit to/from Stand Sit to Stand: Min guard         General transfer comment: min guard for safety, good hand placement     Balance Overall balance assessment: Needs assistance Sitting-balance support: No upper extremity supported;Feet supported Sitting balance-Leahy Scale: Good     Standing balance support: Bilateral upper extremity supported;During functional activity Standing balance-Leahy Scale: Poor Standing balance comment: can release walker momentarily in static standing                           ADL either performed or assessed with clinical judgement   ADL Overall ADL's : Needs  assistance/impaired Eating/Feeding: Independent;Bed level   Grooming: Wash/dry hands;Standing;Min guard   Upper Body Bathing: Set up;Sitting   Lower Body Bathing: Minimal assistance;Sit to/from stand Lower Body Bathing Details (indicate cue type and reason): educated in use of long handled bath sponge Upper Body Dressing : Set up;Sitting   Lower Body Dressing: Minimal assistance;Sit to/from stand Lower Body Dressing Details (indicate cue type and reason): pt will rely on wife for assist, educated in safe footwear and compensatory strategies Toilet Transfer: Min guard;Ambulation;RW Toilet Transfer Details (indicate cue type and reason): pt aware he can place [facility] over toilet to elevate       Tub/Shower Transfer Details (indicate cue type and reason): instructed in technique, wife will supervise Functional mobility during ADLs: Min guard;Rolling walker General ADL Comments: Instructed in how to transport items safely with RW.     Vision Patient Visual Report: No change from baseline       Perception     Praxis      Pertinent Vitals/Pain Pain Assessment: 0-10 Pain Score: 8  Pain Location: R knee Pain Descriptors / Indicators: Aching;Operative site guarding Pain Intervention(s): Monitored during session;Repositioned;Ice applied;Patient requesting pain meds-RN notified     Hand Dominance Right   Extremity/Trunk Assessment Upper Extremity Assessment Upper Extremity Assessment: Overall WFL for tasks assessed   Lower Extremity Assessment Lower Extremity Assessment: Defer to PT evaluation   Cervical / Trunk Assessment Cervical / Trunk Assessment: Normal   Communication Communication Communication: No difficulties   Cognition Arousal/Alertness: Awake/alert Behavior During Therapy: WFL for tasks assessed/performed Overall  Cognitive Status: Within Functional Limits for tasks assessed                                     General Comments  pts wife present  but sleeping, limited in room exercises this visit. will focus next session    Exercises     Shoulder Instructions      Home Living Family/patient expects to be discharged to:: Private residence Living Arrangements: Spouse/significant other Available Help at Discharge: Family;Available 24 hours/day Type of Home: House Home Access: Stairs to enter [employer] of Steps: 2 Entrance Stairs-Rails: Right Home Layout: One level     Bathroom Shower/Tub: Producer, television/film/video: Standard     Home Equipment: Environmental consultant - 2 wheels;Bedside commode;Shower seat;Grab bars - tub/shower          Prior Functioning/Environment Level of Independence: Independent with assistive device(s)        Comments: Used cane for ambulation.         OT Problem List:        OT Treatment/Interventions:      OT Goals(Current goals can be found in the care plan section) Acute Rehab OT Goals Patient Stated Goal: to feel better   OT Frequency:     Barriers to D/C:            Co-evaluation              AM-PAC PT "6 Clicks" Daily Activity     Outcome Measure Help from another person eating meals?: None Help from another person taking care of personal grooming?: A Little Help from another person toileting, which includes using toliet, bedpan, or urinal?: A Little Help from another person bathing (including washing, rinsing, drying)?: A Little Help from another person to put on and taking off regular upper body clothing?: None Help from another person to put on and taking off regular lower body clothing?: A Little 6 Click Score: 20   End of Session Equipment Utilized During Treatment: Gait belt;Rolling walker Nurse Communication: Patient requests pain meds  Activity Tolerance: Patient limited by pain Patient left: in bed;with call bell/phone within reach;with family/visitor present  OT Visit Diagnosis: Pain;Unsteadiness on feet (R26.81);Other abnormalities of gait and  mobility (R26.89) Pain - Right/Left: Right Pain - part of body: Knee                Time: 2197-5883 OT Time Calculation (min): 20 min Charges:  OT General Charges $OT Visit: 1 Visit OT Evaluation $OT Eval Moderate Complexity: 1 Mod G-Codes:     09-12-2017 Martie Round, OTR/L Pager: [phone]  Iran Planas, [place] Bailiff Sep 12, 2017, 9:17 AM

## 2017-08-15 NOTE — Progress Notes (Addendum)
SPORTS MEDICINE AND JOINT REPLACEMENT  Georgena Spurling, MD    Laurier Nancy, PA-C [address] [place], [place], [place]  [address]                             [phone]   PROGRESS NOTE  Subjective:  negative for Chest Pain  negative for Shortness of Breath  negative for Nausea/Vomiting   negative for Calf Pain  negative for Bowel Movement   Tolerating Diet: yes         Patient reports pain as 4 on 0-10 scale.    Objective: Vital signs in last 24 hours:    Patient Vitals for the past 24 hrs:  BP Temp Temp src Pulse Resp SpO2 Height Weight  08/15/17 0536 (!) (P) 146/70 (P) 98.1 F (36.7 C) (P) Oral (P) 76 - (P) 93 % - -  08/15/17 0119 140/64 - - 75 - - - -  08/14/17 2258 - 99.1 F (37.3 C) Axillary - - - - -  08/14/17 1945 - (!) 97.3 F (36.3 C) Oral - - - - -  08/14/17 1943 (!) 183/74 (!) 97.2 F (36.2 C) Oral 71 - - - -  08/14/17 1450 (!) 165/81 98.3 F (36.8 C) Oral 65 - 99 % - -  08/14/17 1432 - 98 F (36.7 C) - 65 18 98 % - -  08/14/17 1426 (!) 160/78 - - (!) 57 11 96 % - -  08/14/17 1415 - - - (!) 56 14 97 % - -  08/14/17 1345 - - - 65 19 97 % - -  08/14/17 1341 (!) 169/80 - - (!) 59 14 97 % - -  08/14/17 1326 (!) 163/95 - - 64 (!) 24 94 % - -  08/14/17 1315 - - - 60 19 94 % - -  08/14/17 1256 (!) 141/70 - - (!) 59 10 99 % - -  08/14/17 1245 - - - (!) 59 11 97 % - -  08/14/17 1241 - 97.9 F (36.6 C) - - - - - -  08/14/17 0940 (!) 149/69 - - (!) 58 14 94 % - -  08/14/17 0935 (!) 152/72 - - (!) 58 (!) 7 97 % - -  08/14/17 0930 (!) 153/78 - - 62 (!) 8 98 % - -  08/14/17 0925 (!) 186/82 - - 63 14 97 % - -  08/14/17 0855 - - - - - - 5\' 11"  (1.803 m) 86.2 kg (190 lb)  08/14/17 0840 (!) 177/84 98 F (36.7 C) Oral 64 20 95 % - -    @flow {1959:LAST@   Intake/Output from previous day:   02/08 0701 - 02/09 0700 In: 1420 [I.V.:1050] Out: 250 [Urine:150]   Intake/Output this shift:   No intake/output data recorded.   Intake/Output      02/08 0701 - 02/09 0700  02/09 0701 - 02/10 0700   I.V. (mL/kg) 1050 (12.2)    IV Piggyback 370    Total Intake(mL/kg) 1420 (16.5)    Urine (mL/kg/hr) 150    Blood 100    Total Output 250    Net +1170         Urine Occurrence 3 x       LABORATORY DATA: Recent Labs    08/15/17 0545  WBC 24.1*  HGB 7.7*  HCT 23.4*  PLT 992*   Recent Labs    08/15/17 0545  NA 133*  K  6.0*  CL 92*  CO2 26  BUN 65*  CREATININE 2.76*  GLUCOSE 92  CALCIUM 8.3*   Lab Results  Component Value Date   INR 0.99 08/03/2017    Examination:  General appearance: alert, cooperative and no distress Extremities: extremities normal, atraumatic, no cyanosis or edema  Wound Exam: clean, dry, intact   Drainage:  None: wound tissue dry  Motor Exam: Quadriceps and Hamstrings Intact  Sensory Exam: Superficial Peroneal, Deep Peroneal and Tibial normal   Assessment:    1 Day Post-Op  Procedure(s) (LRB): RIGHT TOTAL KNEE ARTHROPLASTY (Right)  ADDITIONAL DIAGNOSIS:  Active Problems:   Primary localized osteoarthritis of right knee     Plan: Physical Therapy as ordered Weight Bearing as Tolerated (WBAT)  DVT Prophylaxis:  eliquis  DISCHARGE PLAN: Home  DISCHARGE NEEDS: HHPT  Patient doing well and progressing.  Labs reviewed - will start IV NS at 148ml/hr and recheck labs in a few hours to monitor progress.         Guy Sandifer 08/15/2017, 7:26 AM

## 2017-08-15 NOTE — Anesthesia Postprocedure Evaluation (Signed)
Anesthesia Post Note  Patient: Steven Dixon  Procedure(Dixon) Performed: RIGHT TOTAL KNEE ARTHROPLASTY (Right Knee)     Patient location during evaluation: PACU Anesthesia Type: MAC and Spinal Level of consciousness: oriented and awake and alert Pain management: pain level controlled Vital Signs Assessment: post-procedure vital signs reviewed and stable Respiratory status: spontaneous breathing, respiratory function stable and patient connected to nasal cannula oxygen Cardiovascular status: blood pressure returned to baseline and stable Postop Assessment: no headache, no backache and no apparent nausea or vomiting Anesthetic complications: no    Last Vitals:  Vitals:   08/15/17 0119 08/15/17 0536  BP: 140/64 (!) (P) 146/70  Pulse: 75 (P) 76  Resp:    Temp:  (P) 36.7 C  SpO2:  (P) 93%    Last Pain:  Vitals:   08/15/17 0536  TempSrc: (P) Oral  PainSc:                  Steven Dixon

## 2017-08-15 NOTE — Progress Notes (Signed)
Physical Therapy Treatment Patient Details Name: Steven Dixon MRN: [id number] DOB: 01/24/1941 Today's Date: 08/15/2017    History of Present Illness Pt is a 77 y/o male s/p elective R TKA. PMH includes MI, cardiomyopathy, HTN, CAD s/p stent placement, DM, and CKD III.     PT Comments    Patient progressing well with therapy. Increased ambulation distance and worked on Animator this morning. Patient with good balance and stability in RW, close supervision level for walking. Will focus on stairs and therex next visit.     Follow Up Recommendations  DC plan and follow up therapy as arranged by surgeon;Supervision for mobility/OOB     Equipment Recommendations  None recommended by PT    Recommendations for Other Services       Precautions / Restrictions Precautions Precautions: Knee Precaution Booklet Issued: Yes (comment) Precaution Comments: REviewed knee precautions and supine ther ex.  Required Braces or Orthoses: Knee Immobilizer - Right Knee Immobilizer - Right: Other (comment) Restrictions Weight Bearing Restrictions: Yes RLE Weight Bearing: Weight bearing as tolerated    Mobility  Bed Mobility Overal bed mobility: Needs Assistance Bed Mobility: Supine to Sit     Supine to sit: Min assist     General bed mobility comments: Min A to help bring leg up over EOB with sit to supine.   Transfers Overall transfer level: Needs assistance Equipment used: Rolling walker (2 wheeled) Transfers: Sit to/from Stand Sit to Stand: Min guard         General transfer comment: min guard for safety, good hand placement   Ambulation/Gait Ambulation/Gait assistance: Min guard;Supervision Ambulation Distance (Feet): 200 Feet Assistive device: Rolling walker (2 wheeled) Gait Pattern/deviations: Step-to pattern;Decreased step length - left;Decreased step length - right;Decreased weight shift to right;Antalgic Gait velocity: Decreased    General Gait Details: slow  antalagic gait, step to pattern. cues for heel strike and step length, posture. Patient increase distance without rest breaks.    Stairs            Wheelchair Mobility    Modified Rankin (Stroke Patients Only)       Balance Overall balance assessment: Needs assistance Sitting-balance support: No upper extremity supported;Feet supported Sitting balance-Leahy Scale: Good     Standing balance support: Bilateral upper extremity supported;During functional activity Standing balance-Leahy Scale: Poor Standing balance comment: Reliant on BUE support.                             Cognition Arousal/Alertness: Awake/alert Behavior During Therapy: WFL for tasks assessed/performed Overall Cognitive Status: Within Functional Limits for tasks assessed                                        Exercises      General Comments General comments (skin integrity, edema, etc.): pts wife present but sleeping, limited in room exercises this visit. will focus next session      Pertinent Vitals/Pain Pain Assessment: 0-10 Pain Score: 5  Pain Location: R knee; abdomen Pain Descriptors / Indicators: Aching;Operative site guarding Pain Intervention(s): Limited activity within patient's tolerance;Monitored during session;Premedicated before session;Repositioned    Home Living                      Prior Function            PT Goals (current  goals can now be found in the care plan section) Acute Rehab PT Goals Patient Stated Goal: to feel better  PT Goal Formulation: With patient Time For Goal Achievement: 08/21/17 Potential to Achieve Goals: Good    Frequency    7X/week      PT Plan Current plan remains appropriate    Co-evaluation              AM-PAC PT "6 Clicks" Daily Activity  Outcome Measure  Difficulty turning over in bed (including adjusting bedclothes, sheets and blankets)?: A Little Difficulty moving from lying on back to  sitting on the side of the bed? : A Little Difficulty sitting down on and standing up from a chair with arms (e.g., wheelchair, bedside commode, etc,.)?: A Little Help needed moving to and from a bed to chair (including a wheelchair)?: A Little Help needed walking in hospital room?: A Little Help needed climbing 3-5 steps with a railing? : A Little 6 Click Score: 18    End of Session Equipment Utilized During Treatment: Gait belt;Right knee immobilizer Activity Tolerance: Patient limited by pain Patient left: in chair;with call bell/phone within reach;with family/visitor present Nurse Communication: Mobility status;Other (comment) PT Visit Diagnosis: Other abnormalities of gait and mobility (R26.89);Pain Pain - Right/Left: Right Pain - part of body: Knee     Time: 0800-0828 PT Time Calculation (min) (ACUTE ONLY): 28 min  Charges:  $Gait Training: 23-37 mins                    G Codes:      Etta Grandchild, PT, DPT Acute Rehab Services Pager: [phone]   Etta Grandchild 08/15/2017, 8:50 AM

## 2017-08-15 NOTE — Progress Notes (Signed)
Physical Therapy Treatment Patient Details Name: Steven Dixon MRN: [id number] DOB: [birth year] Today's Date: 08/15/2017    History of Present Illness Pt is a 77 y/o male s/p elective R TKA. PMH includes MI, cardiomyopathy, HTN, CAD s/p stent placement, DM, and CKD III.     PT Comments    Patient unable to progress to stair training this visit, limited by pain. Ambulated to bathroom and back with decreased gait speed due to pain. Focused on therex as patient felt he was in too much pain to ambulate to stair gym today. Discussed plan to focus on stairs tomorrow. Pt and wife agreeable and state they want to d/c home on Monday. Patient with good stability ambulating and tolieting today, feel he will do well tomorrow once pain is controlled.    Follow Up Recommendations  DC plan and follow up therapy as arranged by surgeon;Supervision for mobility/OOB     Equipment Recommendations  None recommended by PT    Recommendations for Other Services       Precautions / Restrictions Precautions Precautions: Knee;Fall Precaution Booklet Issued: Yes (comment) Precaution Comments: educated in no pillow under knee Required Braces or Orthoses: Knee Immobilizer - Right Knee Immobilizer - Right: Other (comment) Restrictions Weight Bearing Restrictions: Yes RLE Weight Bearing: Weight bearing as tolerated    Mobility  Bed Mobility               General bed mobility comments: EOB at entry  Transfers Overall transfer level: Needs assistance Equipment used: Rolling walker (2 wheeled) Transfers: Sit to/from Stand Sit to Stand: Min guard         General transfer comment: min guard, pt more painfult this session. increased time and effort, good hand placement on RW  Ambulation/Gait Ambulation/Gait assistance: Min guard;Supervision Ambulation Distance (Feet): 50 Feet Assistive device: Rolling walker (2 wheeled) Gait Pattern/deviations: Step-to pattern;Decreased step length -  left;Decreased step length - right;Decreased weight shift to right;Antalgic         Stairs            Wheelchair Mobility    Modified Rankin (Stroke Patients Only)       Balance Overall balance assessment: Needs assistance Sitting-balance support: No upper extremity supported;Feet supported Sitting balance-Leahy Scale: Good     Standing balance support: Bilateral upper extremity supported;During functional activity Standing balance-Leahy Scale: Poor Standing balance comment: can release walker momentarily in static standing                            Cognition Arousal/Alertness: Awake/alert Behavior During Therapy: WFL for tasks assessed/performed Overall Cognitive Status: Within Functional Limits for tasks assessed                                        Exercises Total Joint Exercises Ankle Circles/Pumps: 20 reps Quad Sets: 10 reps Towel Squeeze: 10 reps Heel Slides: 15 reps Hip ABduction/ADduction: 15 reps Long Arc Quad: 15 reps Knee Flexion: 15 reps Goniometric ROM: 90 flexion    General Comments        Pertinent Vitals/Pain Pain Assessment: Faces Faces Pain Scale: Hurts whole lot Pain Location: R knee Pain Descriptors / Indicators: Aching;Operative site guarding Pain Intervention(s): Limited activity within patient's tolerance;Monitored during session;Premedicated before session    Home Living  Prior Function            PT Goals (current goals can now be found in the care plan section) Acute Rehab PT Goals Patient Stated Goal: to feel better  PT Goal Formulation: With patient Time For Goal Achievement: 08/21/17 Potential to Achieve Goals: Good Progress towards PT goals: Progressing toward goals    Frequency    7X/week      PT Plan Current plan remains appropriate    Co-evaluation              AM-PAC PT "6 Clicks" Daily Activity  Outcome Measure  Difficulty turning  over in bed (including adjusting bedclothes, sheets and blankets)?: A Little Difficulty moving from lying on back to sitting on the side of the bed? : A Little Difficulty sitting down on and standing up from a chair with arms (e.g., wheelchair, bedside commode, etc,.)?: A Little Help needed moving to and from a bed to chair (including a wheelchair)?: A Little Help needed walking in hospital room?: A Little Help needed climbing 3-5 steps with a railing? : A Little 6 Click Score: 18    End of Session Equipment Utilized During Treatment: Gait belt;Right knee immobilizer Activity Tolerance: Patient limited by pain Patient left: in bed;with call bell/phone within reach;with family/visitor present Nurse Communication: Mobility status;Other (comment) PT Visit Diagnosis: Other abnormalities of gait and mobility (R26.89);Pain Pain - Right/Left: Right Pain - part of body: Knee     Time: 2130-8657 PT Time Calculation (min) (ACUTE ONLY): 33 min  Charges:  $Therapeutic Exercise: 8-22 mins $Therapeutic Activity: 8-22 mins                    G Codes:       Etta Grandchild, PT, DPT Acute Rehab Services Pager: [phone]     Etta Grandchild 08/15/2017, 2:40 PM

## 2017-08-16 LAB — CBC
HEMATOCRIT: 36.3 % — AB (ref 39.0–52.0)
Hemoglobin: 11.8 g/dL — ABNORMAL LOW (ref 13.0–17.0)
MCH: 29.2 pg (ref 26.0–34.0)
MCHC: 32.5 g/dL (ref 30.0–36.0)
MCV: 89.9 fL (ref 78.0–100.0)
Platelets: 229 10*3/uL (ref 150–400)
RBC: 4.04 MIL/uL — ABNORMAL LOW (ref 4.22–5.81)
RDW: 13.1 % (ref 11.5–15.5)
WBC: 10.7 10*3/uL — ABNORMAL HIGH (ref 4.0–10.5)

## 2017-08-16 NOTE — Discharge Instructions (Signed)
INSTRUCTIONS AFTER JOINT REPLACEMENT  ° °o Remove items at home which could result in a fall. This includes throw rugs or furniture in walking pathways °o ICE to the affected joint every three hours while awake for 30 minutes at a time, for at least the first 3-5 days, and then as needed for pain and swelling.  Continue to use ice for pain and swelling. You may notice swelling that will progress down to the foot and ankle.  This is normal after surgery.  Elevate your leg when you are not up walking on it.   °o Continue to use the breathing machine you got in the hospital (incentive spirometer) which will help keep your temperature down.  It is common for your temperature to cycle up and down following surgery, especially at night when you are not up moving around and exerting yourself.  The breathing machine keeps your lungs expanded and your temperature down. ° ° °DIET:  As you were doing prior to hospitalization, we recommend a well-balanced diet. ° °DRESSING / WOUND CARE / SHOWERING ° °You may change your dressing 3-5 days after surgery.  Then change the dressing every day with sterile gauze.  Please use good hand washing techniques before changing the dressing.  Do not use any lotions or creams on the incision until instructed by your surgeon. ° °ACTIVITY ° °o Increase activity slowly as tolerated, but follow the weight bearing instructions below.   °o No driving for 6 weeks or until further direction given by your physician.  You cannot drive while taking narcotics.  °o No lifting or carrying greater than 10 lbs. until further directed by your surgeon. °o Avoid periods of inactivity such as sitting longer than an hour when not asleep. This helps prevent blood clots.  °o You may return to work once you are authorized by your doctor.  ° ° ° °WEIGHT BEARING  ° °Weight bearing as tolerated with assist device (walker, cane, etc) as directed, use it as long as suggested by your surgeon or therapist, typically at  least 4-6 weeks. ° ° °EXERCISES ° °Results after joint replacement surgery are often greatly improved when you follow the exercise, range of motion and muscle strengthening exercises prescribed by your doctor. Safety measures are also important to protect the joint from further injury. Any time any of these exercises cause you to have increased pain or swelling, decrease what you are doing until you are comfortable again and then slowly increase them. If you have problems or questions, call your caregiver or physical therapist for advice.  ° °Rehabilitation is important following a joint replacement. After just a few days of immobilization, the muscles of the leg can become weakened and shrink (atrophy).  These exercises are designed to build up the tone and strength of the thigh and leg muscles and to improve motion. Often times heat used for twenty to thirty minutes before working out will loosen up your tissues and help with improving the range of motion but do not use heat for the first two weeks following surgery (sometimes heat can increase post-operative swelling).  ° °These exercises can be done on a training (exercise) mat, on the floor, on a table or on a bed. Use whatever works the best and is most comfortable for you.    Use music or television while you are exercising so that the exercises are a pleasant break in your day. This will make your life better with the exercises acting as a break   in your routine that you can look forward to.   Perform all exercises about fifteen times, three times per day or as directed.  You should exercise both the operative leg and the other leg as well. ° °Exercises include: °  °• Quad Sets - Tighten up the muscle on the front of the thigh (Quad) and hold for 5-10 seconds.   °• Straight Leg Raises - With your knee straight (if you were given a brace, keep it on), lift the leg to 60 degrees, hold for 3 seconds, and slowly lower the leg.  Perform this exercise against  resistance later as your leg gets stronger.  °• Leg Slides: Lying on your back, slowly slide your foot toward your buttocks, bending your knee up off the floor (only go as far as is comfortable). Then slowly slide your foot back down until your leg is flat on the floor again.  °• Angel Wings: Lying on your back spread your legs to the side as far apart as you can without causing discomfort.  °• Hamstring Strength:  Lying on your back, push your heel against the floor with your leg straight by tightening up the muscles of your buttocks.  Repeat, but this time bend your knee to a comfortable angle, and push your heel against the floor.  You may put a pillow under the heel to make it more comfortable if necessary.  ° °A rehabilitation program following joint replacement surgery can speed recovery and prevent re-injury in the future due to weakened muscles. Contact your doctor or a physical therapist for more information on knee rehabilitation.  ° ° °CONSTIPATION ° °Constipation is defined medically as fewer than three stools per week and severe constipation as less than one stool per week.  Even if you have a regular bowel pattern at home, your normal regimen is likely to be disrupted due to multiple reasons following surgery.  Combination of anesthesia, postoperative narcotics, change in appetite and fluid intake all can affect your bowels.  ° °YOU MUST use at least one of the following options; they are listed in order of increasing strength to get the job done.  They are all available over the counter, and you may need to use some, POSSIBLY even all of these options:   ° °Drink plenty of fluids (prune juice may be helpful) and high fiber foods °Colace 100 mg by mouth twice a day  °Senokot for constipation as directed and as needed Dulcolax (bisacodyl), take with full glass of water  °Miralax (polyethylene glycol) once or twice a day as needed. ° °If you have tried all these things and are unable to have a bowel  movement in the first 3-4 days after surgery call either your surgeon or your primary doctor.   ° °If you experience loose stools or diarrhea, hold the medications until you stool forms back up.  If your symptoms do not get better within 1 week or if they get worse, check with your doctor.  If you experience "the worst abdominal pain ever" or develop nausea or vomiting, please contact the office immediately for further recommendations for treatment. ° ° °ITCHING:  If you experience itching with your medications, try taking only a single pain pill, or even half a pain pill at a time.  You can also use Benadryl over the counter for itching or also to help with sleep.  ° °TED HOSE STOCKINGS:  Use stockings on both legs until for at least 2 weeks or as   directed by physician office. They may be removed at night for sleeping. ° °MEDICATIONS:  See your medication summary on the “After Visit Summary” that nursing will review with you.  You may have some home medications which will be placed on hold until you complete the course of blood thinner medication.  It is important for you to complete the blood thinner medication as prescribed. ° °PRECAUTIONS:  If you experience chest pain or shortness of breath - call 911 immediately for transfer to the hospital emergency department.  ° °If you develop a fever greater that 101 F, purulent drainage from wound, increased redness or drainage from wound, foul odor from the wound/dressing, or calf pain - CONTACT YOUR SURGEON.   °                                                °FOLLOW-UP APPOINTMENTS:  If you do not already have a post-op appointment, please call the office for an appointment to be seen by your surgeon.  Guidelines for how soon to be seen are listed in your “After Visit Summary”, but are typically between 1-4 weeks after surgery. ° °OTHER INSTRUCTIONS:  ° °Knee Replacement:  Do not place pillow under knee, focus on keeping the knee straight while resting. CPM  instructions: 0-90 degrees, 2 hours in the morning, 2 hours in the afternoon, and 2 hours in the evening. Place foam block, curve side up under heel at all times except when in CPM or when walking.  DO NOT modify, tear, cut, or change the foam block in any way. ° °MAKE SURE YOU:  °• Understand these instructions.  °• Get help right away if you are not doing well or get worse.  ° ° °Thank you for letting us be a part of your medical care team.  It is a privilege we respect greatly.  We hope these instructions will help you stay on track for a fast and full recovery!  ° ° ° ° ° °Information on my medicine - ELIQUIS® (apixaban) ° °Why was Eliquis® prescribed for you? °Eliquis® was prescribed for you to reduce the risk of blood clots forming after orthopedic surgery.   ° °What do You need to know about Eliquis®? °Take your Eliquis® TWICE DAILY - one tablet in the morning and one tablet in the evening with or without food.  It would be best to take the dose about the same time each day. ° °If you have difficulty swallowing the tablet whole please discuss with your pharmacist how to take the medication safely. ° °Take Eliquis® exactly as prescribed by your doctor and DO NOT stop taking Eliquis® without talking to the doctor who prescribed the medication.  Stopping without other medication to take the place of Eliquis® may increase your risk of developing a clot. ° °After discharge, you should have regular check-up appointments with your healthcare provider that is prescribing your Eliquis®. ° °What do you do if you miss a dose? °If a dose of ELIQUIS® is not taken at the scheduled time, take it as soon as possible on the same day and twice-daily administration should be resumed.  The dose should not be doubled to make up for a missed dose.  Do not take more than one tablet of ELIQUIS at the same time. ° °Important Safety Information °A possible side effect of Eliquis®   is bleeding. You should call your healthcare provider  right away if you experience any of the following: °? Bleeding from an injury or your nose that does not stop. °? Unusual colored urine (red or dark brown) or unusual colored stools (red or black). °? Unusual bruising for unknown reasons. °? A serious fall or if you hit your head (even if there is no bleeding). ° °Some medicines may interact with Eliquis® and might increase your risk of bleeding or clotting while on Eliquis®. To help avoid this, consult your healthcare provider or pharmacist prior to using any new prescription or non-prescription medications, including herbals, vitamins, non-steroidal anti-inflammatory drugs (NSAIDs) and supplements. ° °This website has more information on Eliquis® (apixaban): http://[email or web address].com/eliquis/home ° ° °

## 2017-08-16 NOTE — Progress Notes (Signed)
SPORTS MEDICINE AND JOINT REPLACEMENT  Georgena Spurling, MD    Laurier Nancy, PA-C [address] [place], [place], [place]  [address]                             [phone]   PROGRESS NOTE  Subjective:  negative for Chest Pain  negative for Shortness of Breath  negative for Nausea/Vomiting   negative for Calf Pain  negative for Bowel Movement   Tolerating Diet: yes         Patient reports pain as 4 on 0-10 scale.    Objective: Vital signs in last 24 hours:    Patient Vitals for the past 24 hrs:  BP Temp Temp src Pulse SpO2  08/16/17 0426 (!) 119/54 98.5 F (36.9 C) Oral 68 95 %  08/15/17 2035 (!) 165/71 99.1 F (37.3 C) Oral 78 100 %  08/15/17 1405 (!) 157/56 98.3 F (36.8 C) Oral 76 96 %    @flow {1959:LAST@   Intake/Output from previous day:   No intake/output data recorded.   Intake/Output this shift:   No intake/output data recorded.   Intake/Output      02/09 0701 - 02/10 0700 02/10 0701 - 02/11 0700   I.V. (mL/kg)     IV Piggyback     Total Intake(mL/kg)     Urine (mL/kg/hr)     Blood     Total Output     Net          Urine Occurrence 6 x       LABORATORY DATA: Recent Labs    08/15/17 0545 08/15/17 1107  WBC QUESTIONABLE RESULTS, RECOMMEND RECOLLECT TO VERIFY 10.9*  HGB QUESTIONABLE RESULTS, RECOMMEND RECOLLECT TO VERIFY 12.6*  HCT QUESTIONABLE RESULTS, RECOMMEND RECOLLECT TO VERIFY 38.2*  PLT QUESTIONABLE RESULTS, RECOMMEND RECOLLECT TO VERIFY 239   Recent Labs    08/15/17 0545 08/15/17 1107  NA QUESTIONABLE RESULTS, RECOMMEND RECOLLECT TO VERIFY 135  K QUESTIONABLE RESULTS, RECOMMEND RECOLLECT TO VERIFY 4.0  CL QUESTIONABLE RESULTS, RECOMMEND RECOLLECT TO VERIFY 99*  CO2 QUESTIONABLE RESULTS, RECOMMEND RECOLLECT TO VERIFY 27  BUN QUESTIONABLE RESULTS, RECOMMEND RECOLLECT TO VERIFY 17  CREATININE QUESTIONABLE RESULTS, RECOMMEND RECOLLECT TO VERIFY 1.49*  GLUCOSE QUESTIONABLE RESULTS, RECOMMEND RECOLLECT TO VERIFY 181*  CALCIUM QUESTIONABLE  RESULTS, RECOMMEND RECOLLECT TO VERIFY 8.4*   Lab Results  Component Value Date   INR 0.99 08/03/2017    Examination:  General appearance: alert, cooperative and no distress Extremities: extremities normal, atraumatic, no cyanosis or edema  Wound Exam: clean, dry, intact   Drainage:  None: wound tissue dry  Motor Exam: Quadriceps and Hamstrings Intact  Sensory Exam: Superficial Peroneal, Deep Peroneal and Tibial normal   Assessment:    2 Days Post-Op  Procedure(s) (LRB): RIGHT TOTAL KNEE ARTHROPLASTY (Right)  ADDITIONAL DIAGNOSIS:  Active Problems:   Primary localized osteoarthritis of right knee    Plan: Physical Therapy as ordered Weight Bearing as Tolerated (WBAT)  DVT Prophylaxis:  eliquis  DISCHARGE PLAN: Home  DISCHARGE NEEDS: HHPT   Patient progressing well and is ready for D/C home today. Will put in D/C orders. Rx in chart. Ready to go home once PT has been completed today         Guy Sandifer 08/16/2017, 8:46 AM

## 2017-08-16 NOTE — Progress Notes (Signed)
Physical Therapy Treatment Patient Details Name: Steven Dixon MRN: [id number] DOB: May 02, 1941 Today's Date: 08/16/2017    History of Present Illness Pt is a 77 y/o male s/p elective R TKA. PMH includes MI, cardiomyopathy, HTN, CAD s/p stent placement, DM, and CKD III.     PT Comments    Patient continues to make progress toward mobility goals and tolerated increased gait distance and stair training this session. Pt reported less pain this am vs previous sessions. Continue to progress as tolerated.    Follow Up Recommendations  DC plan and follow up therapy as arranged by surgeon;Supervision for mobility/OOB     Equipment Recommendations  None recommended by PT    Recommendations for Other Services       Precautions / Restrictions Precautions Precautions: Knee;Fall Precaution Comments: precautions reviewed with pt Required Braces or Orthoses: Knee Immobilizer - Right Restrictions Weight Bearing Restrictions: Yes RLE Weight Bearing: Weight bearing as tolerated    Mobility  Bed Mobility Overal bed mobility: Needs Assistance Bed Mobility: Sit to Supine       Sit to supine: Min assist   General bed mobility comments: assist to bring R LE into bed; cues for technique; pt able to use L LE to raise R LE most of the way  Transfers Overall transfer level: Needs assistance Equipment used: Rolling walker (2 wheeled) Transfers: Sit to/from Stand Sit to Stand: Min guard         General transfer comment: min guard, pt more painfult this session. increased time and effort, good hand placement on RW  Ambulation/Gait Ambulation/Gait assistance: Supervision Ambulation Distance (Feet): 100 Feet Assistive device: Rolling walker (2 wheeled) Gait Pattern/deviations: Step-to pattern;Decreased step length - left;Decreased weight shift to right;Antalgic;Decreased stance time - right;Trunk flexed     General Gait Details: cues for R heel strike/toe off, R quad activiation during  stance phase, and increased L step length/height as pt tends to slide L foot forward    Stairs Stairs: Yes   Stair Management: No rails;Step to pattern;Backwards;With walker Number of Stairs: 2 General stair comments: cues for sequencing and technique; assist to stabilize RW  Wheelchair Mobility    Modified Rankin (Stroke Patients Only)       Balance Overall balance assessment: Needs assistance Sitting-balance support: No upper extremity supported;Feet supported Sitting balance-Leahy Scale: Good     Standing balance support: Bilateral upper extremity supported;During functional activity Standing balance-Leahy Scale: Poor                              Cognition Arousal/Alertness: Awake/alert Behavior During Therapy: WFL for tasks assessed/performed Overall Cognitive Status: Within Functional Limits for tasks assessed                                        Exercises      General Comments        Pertinent Vitals/Pain Pain Assessment: Faces Faces Pain Scale: Hurts little more Pain Location: R knee Pain Descriptors / Indicators: Grimacing;Guarding;Sore Pain Intervention(s): Limited activity within patient's tolerance;Monitored during session;Repositioned;Ice applied    Home Living                      Prior Function            PT Goals (current goals can now be found in the care plan  section) Acute Rehab PT Goals Patient Stated Goal: to feel better  PT Goal Formulation: With patient Time For Goal Achievement: 08/21/17 Potential to Achieve Goals: Good Progress towards PT goals: Progressing toward goals    Frequency    7X/week      PT Plan Current plan remains appropriate    Co-evaluation              AM-PAC PT "6 Clicks" Daily Activity  Outcome Measure  Difficulty turning over in bed (including adjusting bedclothes, sheets and blankets)?: A Little Difficulty moving from lying on back to sitting on the  side of the bed? : A Little Difficulty sitting down on and standing up from a chair with arms (e.g., wheelchair, bedside commode, etc,.)?: A Little Help needed moving to and from a bed to chair (including a wheelchair)?: A Little Help needed walking in hospital room?: A Little Help needed climbing 3-5 steps with a railing? : A Little 6 Click Score: 18    End of Session Equipment Utilized During Treatment: Gait belt Activity Tolerance: Patient tolerated treatment well Patient left: in bed;with call bell/phone within reach;with family/visitor present Nurse Communication: Mobility status PT Visit Diagnosis: Other abnormalities of gait and mobility (R26.89);Pain Pain - Right/Left: Right Pain - part of body: Knee     Time: 1610-9604 PT Time Calculation (min) (ACUTE ONLY): 33 min  Charges:  $Gait Training: 23-37 mins                    G Codes:       Erline Levine, PTA Pager: [phone]     Carolynne Edouard 08/16/2017, 10:28 AM

## 2017-08-16 NOTE — Care Management (Signed)
Spoke with patient, who states he has all equipment and no questions about [facility] providing HH PT.  Katrina with [facility] notified of pt's discharge.

## 2017-08-16 NOTE — Progress Notes (Signed)
Physical Therapy Treatment Patient Details Name: Steven Dixon MRN: [id number] DOB: 1940-08-31 Today's Date: 08/16/2017    History of Present Illness Pt is a 77 y/o male s/p elective R TKA. PMH includes MI, cardiomyopathy, HTN, CAD s/p stent placement, DM, and CKD III.     PT Comments    Patient continues to make progress with mobility and tolerated session well. This session focused on HEP and gait training. Current plan remains appropriate.     Follow Up Recommendations  DC plan and follow up therapy as arranged by surgeon;Supervision for mobility/OOB     Equipment Recommendations  None recommended by PT    Recommendations for Other Services       Precautions / Restrictions Precautions Precautions: Knee;Fall Precaution Booklet Issued: Yes (comment) Precaution Comments: precautions reviewed with pt Required Braces or Orthoses: Knee Immobilizer - Right Restrictions Weight Bearing Restrictions: Yes RLE Weight Bearing: Weight bearing as tolerated    Mobility  Bed Mobility Overal bed mobility: Needs Assistance Bed Mobility: Supine to Sit     Supine to sit: Supervision Sit to supine: Min assist   General bed mobility comments: supervision for safety; cues for technique  Transfers Overall transfer level: Needs assistance Equipment used: Rolling walker (2 wheeled) Transfers: Sit to/from Stand Sit to Stand: Min guard         General transfer comment: cues for safe hand placement  Ambulation/Gait Ambulation/Gait assistance: Supervision Ambulation Distance (Feet): 100 Feet Assistive device: Rolling walker (2 wheeled) Gait Pattern/deviations: Decreased step length - left;Decreased weight shift to right;Antalgic;Decreased stance time - right;Trunk flexed;Step-through pattern Gait velocity: Decreased    General Gait Details: cues for posture, sequencing of gait with use of AD, and R heel strike   Stairs Stairs: Yes   Stair Management: No rails;Step to  pattern;Backwards;With walker Number of Stairs: 2 General stair comments: cues for sequencing and technique; assist to stabilize RW  Wheelchair Mobility    Modified Rankin (Stroke Patients Only)       Balance Overall balance assessment: Needs assistance Sitting-balance support: No upper extremity supported;Feet supported Sitting balance-Leahy Scale: Good     Standing balance support: Bilateral upper extremity supported;During functional activity Standing balance-Leahy Scale: Poor Standing balance comment: fair-- pt is able to static stand without UE support                            Cognition Arousal/Alertness: Awake/alert Behavior During Therapy: WFL for tasks assessed/performed Overall Cognitive Status: Within Functional Limits for tasks assessed                                        Exercises Total Joint Exercises Ankle Circles/Pumps: AROM;Both;10 reps Quad Sets: AROM;Right;10 reps Short Arc QuadBarbaraann Boys;Right;10 reps Heel Slides: AAROM;Right;10 reps Hip ABduction/ADduction: AAROM;Right;10 reps Straight Leg Raises: AAROM;Right;5 reps Knee Flexion: AROM;AAROM;Right;5 reps;Other (comment);Seated(10 sec holds)    General Comments        Pertinent Vitals/Pain Pain Assessment: Faces Faces Pain Scale: Hurts little more Pain Location: R knee Pain Descriptors / Indicators: Grimacing;Guarding;Sore Pain Intervention(s): Limited activity within patient's tolerance;Monitored during session;Repositioned;RN gave pain meds during session    Home Living                      Prior Function            PT Goals (current  goals can now be found in the care plan section) Acute Rehab PT Goals Patient Stated Goal: to feel better  PT Goal Formulation: With patient Time For Goal Achievement: 08/21/17 Potential to Achieve Goals: Good Progress towards PT goals: Progressing toward goals    Frequency    7X/week      PT Plan Current  plan remains appropriate    Co-evaluation              AM-PAC PT "6 Clicks" Daily Activity  Outcome Measure  Difficulty turning over in bed (including adjusting bedclothes, sheets and blankets)?: A Little Difficulty moving from lying on back to sitting on the side of the bed? : A Little Difficulty sitting down on and standing up from a chair with arms (e.g., wheelchair, bedside commode, etc,.)?: A Little Help needed moving to and from a bed to chair (including a wheelchair)?: A Little Help needed walking in hospital room?: A Little Help needed climbing 3-5 steps with a railing? : A Little 6 Click Score: 18    End of Session Equipment Utilized During Treatment: Gait belt Activity Tolerance: Patient tolerated treatment well Patient left: with call bell/phone within reach;with family/visitor present;Other (comment)(sitting EOB getting dressed with wife's assistance) Nurse Communication: Mobility status PT Visit Diagnosis: Other abnormalities of gait and mobility (R26.89);Pain Pain - Right/Left: Right Pain - part of body: Knee     Time: 1610-9604 PT Time Calculation (min) (ACUTE ONLY): 38 min  Charges:  $Gait Training: 8-22 mins $Therapeutic Exercise: 23-37 mins                    G Codes:       Erline Levine, PTA Pager: [phone]     Carolynne Edouard 08/16/2017, 1:55 PM

## 2017-08-16 NOTE — Discharge Summary (Signed)
SPORTS MEDICINE & JOINT REPLACEMENT   Georgena Spurling, MD   Laurier Nancy, PA-C [address] [place], [place], [place]  [address]                             [phone]  PATIENT ID: Steven Dixon        MRN:  [id number]          DOB/AGE: 1941-06-28 / 77 y.o.    DISCHARGE SUMMARY  ADMISSION DATE:    08/14/2017 DISCHARGE DATE:   08/16/2017   ADMISSION DIAGNOSIS: OA RIGHT KNEE    DISCHARGE DIAGNOSIS:  OA RIGHT KNEE    ADDITIONAL DIAGNOSIS: Active Problems:   Primary localized osteoarthritis of right knee  Past Medical History:  Diagnosis Date  . Arthritis   . Cardiomyopathy (HCC)   . Coronary artery disease   . Hyperlipidemia   . Hypertension   . MI (mitral incompetence)   . Myocardial infarction (HCC)   . Pre-diabetes   . Primary localized osteoarthritis of knee    Right    PROCEDURE: Procedure(s): RIGHT TOTAL KNEE ARTHROPLASTY on 08/14/2017  CONSULTS:    HISTORY:  See H&P in chart  HOSPITAL COURSE:  Steven Dixon is a 77 y.o. admitted on 08/14/2017 and found to have a diagnosis of OA RIGHT KNEE.  After appropriate laboratory studies were obtained  they were taken to the operating room on 08/14/2017 and underwent Procedure(s): RIGHT TOTAL KNEE ARTHROPLASTY.   They were given perioperative antibiotics:  Anti-infectives (From admission, onward)   Start     Dose/Rate Route Frequency Ordered Stop   08/14/17 1630  ceFAZolin (ANCEF) IVPB 1 g/50 mL premix     1 g 100 mL/hr over 30 Minutes Intravenous Every 6 hours 08/14/17 1454 08/14/17 2230   08/14/17 0945  ceFAZolin (ANCEF) IVPB 2g/100 mL premix     2 g 200 mL/hr over 30 Minutes Intravenous On call to O.R. 08/14/17 0840 08/14/17 1030    .  Patient given tranexamic acid IV or topical and exparel intra-operatively.  Tolerated the procedure well.    POD# 1: Vital signs were stable.  Patient denied Chest pain, shortness of breath, or calf pain.  Patient was started on Lovenox 30 mg subcutaneously twice daily at 8am.   Consults to PT, OT, and care management were made.  The patient was weight bearing as tolerated.  CPM was placed on the operative leg 0-90 degrees for 6-8 hours a day. When out of the CPM, patient was placed in the foam block to achieve full extension. Incentive spirometry was taught.  Dressing was changed.       POD #2, Continued  PT for ambulation and exercise program.  IV saline locked.  O2 discontinued.    The remainder of the hospital course was dedicated to ambulation and strengthening.   The patient was discharged on 2 Days Post-Op in  Good condition.  Blood products given:none  DIAGNOSTIC STUDIES: Recent vital signs:  Patient Vitals for the past 24 hrs:  BP Temp Temp src Pulse SpO2  08/16/17 0426 (!) 119/54 98.5 F (36.9 C) Oral 68 95 %  08/15/17 2035 (!) 165/71 99.1 F (37.3 C) Oral 78 100 %  08/15/17 1405 (!) 157/56 98.3 F (36.8 C) Oral 76 96 %       Recent laboratory studies: Recent Labs    08/15/17 0545 08/15/17 1107  WBC QUESTIONABLE RESULTS, RECOMMEND RECOLLECT TO VERIFY 10.9*  HGB  QUESTIONABLE RESULTS, RECOMMEND RECOLLECT TO VERIFY 12.6*  HCT QUESTIONABLE RESULTS, RECOMMEND RECOLLECT TO VERIFY 38.2*  PLT QUESTIONABLE RESULTS, RECOMMEND RECOLLECT TO VERIFY 239   Recent Labs    08/15/17 0545 08/15/17 1107  NA QUESTIONABLE RESULTS, RECOMMEND RECOLLECT TO VERIFY 135  K QUESTIONABLE RESULTS, RECOMMEND RECOLLECT TO VERIFY 4.0  CL QUESTIONABLE RESULTS, RECOMMEND RECOLLECT TO VERIFY 99*  CO2 QUESTIONABLE RESULTS, RECOMMEND RECOLLECT TO VERIFY 27  BUN QUESTIONABLE RESULTS, RECOMMEND RECOLLECT TO VERIFY 17  CREATININE QUESTIONABLE RESULTS, RECOMMEND RECOLLECT TO VERIFY 1.49*  GLUCOSE QUESTIONABLE RESULTS, RECOMMEND RECOLLECT TO VERIFY 181*  CALCIUM QUESTIONABLE RESULTS, RECOMMEND RECOLLECT TO VERIFY 8.4*   Lab Results  Component Value Date   INR 0.99 08/03/2017     Recent Radiographic Studies :  Dg Chest 2 View  Result Date: 08/03/2017 CLINICAL DATA:  Preop  chest x-ray for knee arthroplasty. EXAM: CHEST  2 VIEW COMPARISON:  None. FINDINGS: The heart size and mediastinal contours are within normal limits. Both lungs are clear. The visualized skeletal structures are unremarkable. IMPRESSION: Negative two view chest x-ray Electronically Signed   By: Marin Roberts M.D.   On: 08/03/2017 17:29    DISCHARGE INSTRUCTIONS: Discharge Instructions    Call MD / Call 911   Complete by:  As directed    If you experience chest pain or shortness of breath, CALL 911 and be transported to the hospital emergency room.  If you develope a fever above 101 F, pus (white drainage) or increased drainage or redness at the wound, or calf pain, call your surgeon's office.   Constipation Prevention   Complete by:  As directed    Drink plenty of fluids.  Prune juice may be helpful.  You may use a stool softener, such as Colace (over the counter) 100 mg twice a day.  Use MiraLax (over the counter) for constipation as needed.   Diet - low sodium heart healthy   Complete by:  As directed    Discharge instructions   Complete by:  As directed    INSTRUCTIONS AFTER JOINT REPLACEMENT   Remove items at home which could result in a fall. This includes throw rugs or furniture in walking pathways ICE to the affected joint every three hours while awake for 30 minutes at a time, for at least the first 3-5 days, and then as needed for pain and swelling.  Continue to use ice for pain and swelling. You may notice swelling that will progress down to the foot and ankle.  This is normal after surgery.  Elevate your leg when you are not up walking on it.   Continue to use the breathing machine you got in the hospital (incentive spirometer) which will help keep your temperature down.  It is common for your temperature to cycle up and down following surgery, especially at night when you are not up moving around and exerting yourself.  The breathing machine keeps your lungs expanded and your  temperature down.   DIET:  As you were doing prior to hospitalization, we recommend a well-balanced diet.  DRESSING / WOUND CARE / SHOWERING  You may change your dressing 3-5 days after surgery.  Then change the dressing every day with sterile gauze.  Please use good hand washing techniques before changing the dressing.  Do not use any lotions or creams on the incision until instructed by your surgeon.  ACTIVITY  Increase activity slowly as tolerated, but follow the weight bearing instructions below.   No driving for 6 weeks  or until further direction given by your physician.  You cannot drive while taking narcotics.  No lifting or carrying greater than 10 lbs. until further directed by your surgeon. Avoid periods of inactivity such as sitting longer than an hour when not asleep. This helps prevent blood clots.  You may return to work once you are authorized by your doctor.     WEIGHT BEARING   Weight bearing as tolerated with assist device (walker, cane, etc) as directed, use it as long as suggested by your surgeon or therapist, typically at least 4-6 weeks.   EXERCISES  Results after joint replacement surgery are often greatly improved when you follow the exercise, range of motion and muscle strengthening exercises prescribed by your doctor. Safety measures are also important to protect the joint from further injury. Any time any of these exercises cause you to have increased pain or swelling, decrease what you are doing until you are comfortable again and then slowly increase them. If you have problems or questions, call your caregiver or physical therapist for advice.   Rehabilitation is important following a joint replacement. After just a few days of immobilization, the muscles of the leg can become weakened and shrink (atrophy).  These exercises are designed to build up the tone and strength of the thigh and leg muscles and to improve motion. Often times heat used for twenty to  thirty minutes before working out will loosen up your tissues and help with improving the range of motion but do not use heat for the first two weeks following surgery (sometimes heat can increase post-operative swelling).   These exercises can be done on a training (exercise) mat, on the floor, on a table or on a bed. Use whatever works the best and is most comfortable for you.    Use music or television while you are exercising so that the exercises are a pleasant break in your day. This will make your life better with the exercises acting as a break in your routine that you can look forward to.   Perform all exercises about fifteen times, three times per day or as directed.  You should exercise both the operative leg and the other leg as well.   Exercises include:   Quad Sets - Tighten up the muscle on the front of the thigh (Quad) and hold for 5-10 seconds.   Straight Leg Raises - With your knee straight (if you were given a brace, keep it on), lift the leg to 60 degrees, hold for 3 seconds, and slowly lower the leg.  Perform this exercise against resistance later as your leg gets stronger.  Leg Slides: Lying on your back, slowly slide your foot toward your buttocks, bending your knee up off the floor (only go as far as is comfortable). Then slowly slide your foot back down until your leg is flat on the floor again.  Angel Wings: Lying on your back spread your legs to the side as far apart as you can without causing discomfort.  Hamstring Strength:  Lying on your back, push your heel against the floor with your leg straight by tightening up the muscles of your buttocks.  Repeat, but this time bend your knee to a comfortable angle, and push your heel against the floor.  You may put a pillow under the heel to make it more comfortable if necessary.   A rehabilitation program following joint replacement surgery can speed recovery and prevent re-injury in the future due to weakened  muscles. Contact your  doctor or a physical therapist for more information on knee rehabilitation.    CONSTIPATION  Constipation is defined medically as fewer than three stools per week and severe constipation as less than one stool per week.  Even if you have a regular bowel pattern at home, your normal regimen is likely to be disrupted due to multiple reasons following surgery.  Combination of anesthesia, postoperative narcotics, change in appetite and fluid intake all can affect your bowels.   YOU MUST use at least one of the following options; they are listed in order of increasing strength to get the job done.  They are all available over the counter, and you may need to use some, POSSIBLY even all of these options:    Drink plenty of fluids (prune juice may be helpful) and high fiber foods Colace 100 mg by mouth twice a day  Senokot for constipation as directed and as needed Dulcolax (bisacodyl), take with full glass of water  Miralax (polyethylene glycol) once or twice a day as needed.  If you have tried all these things and are unable to have a bowel movement in the first 3-4 days after surgery call either your surgeon or your primary doctor.    If you experience loose stools or diarrhea, hold the medications until you stool forms back up.  If your symptoms do not get better within 1 week or if they get worse, check with your doctor.  If you experience "the worst abdominal pain ever" or develop nausea or vomiting, please contact the office immediately for further recommendations for treatment.   ITCHING:  If you experience itching with your medications, try taking only a single pain pill, or even half a pain pill at a time.  You can also use Benadryl over the counter for itching or also to help with sleep.   TED HOSE STOCKINGS:  Use stockings on both legs until for at least 2 weeks or as directed by physician office. They may be removed at night for sleeping.  MEDICATIONS:  See your medication summary on the  "After Visit Summary" that nursing will review with you.  You may have some home medications which will be placed on hold until you complete the course of blood thinner medication.  It is important for you to complete the blood thinner medication as prescribed.  PRECAUTIONS:  If you experience chest pain or shortness of breath - call 911 immediately for transfer to the hospital emergency department.   If you develop a fever greater that 101 F, purulent drainage from wound, increased redness or drainage from wound, foul odor from the wound/dressing, or calf pain - CONTACT YOUR SURGEON.                                                   FOLLOW-UP APPOINTMENTS:  If you do not already have a post-op appointment, please call the office for an appointment to be seen by your surgeon.  Guidelines for how soon to be seen are listed in your "After Visit Summary", but are typically between 1-4 weeks after surgery.  OTHER INSTRUCTIONS:   Knee Replacement:  Do not place pillow under knee, focus on keeping the knee straight while resting. CPM instructions: 0-90 degrees, 2 hours in the morning, 2 hours in the afternoon, and 2 hours in the  evening. Place foam block, curve side up under heel at all times except when in CPM or when walking.  DO NOT modify, tear, cut, or change the foam block in any way.  MAKE SURE YOU:  Understand these instructions.  Get help right away if you are not doing well or get worse.    Thank you for letting us be a part of your medical care team.  It is a privilege we respect greatly.  We hope these instructions will help you stay on track for a fast and full recovery!   Increase activity slowly as tolerated   Complete by:  As directed       DISCHARGE MEDICATIONS:   Allergies as of 08/16/2017   No Known Allergies     Medication List    STOP taking these medications   HYDROcodone-acetaminophen 5-325 MG tablet Commonly known as:  NORCO/VICODIN     TAKE these medications    apixaban 2.5 MG Tabs tablet Commonly known as:  ELIQUIS Take 1 tablet (2.5 mg total) by mouth 2 (two) times daily.   aspirin EC 81 MG tablet Take 81 mg by mouth daily.   carvedilol 6.25 MG tablet Commonly known as:  COREG Take 1 tablet (6.25 mg total) 2 (two) times daily by mouth.   cholecalciferol 1000 units tablet Commonly known as:  VITAMIN D Take 1,000 Units by mouth daily.   nitroGLYCERIN 0.4 MG SL tablet Commonly known as:  NITROSTAT Place 0.4 mg under the tongue as needed for chest pain.   oxyCODONE 5 MG immediate release tablet Commonly known as:  Oxy IR/ROXICODONE 1-2 tabs po q4-6hrs prn pain   pravastatin 40 MG tablet Commonly known as:  PRAVACHOL Take 1 tablet (40 mg total) every evening by mouth. What changed:    when to take this  additional instructions   ramipril 10 MG capsule Commonly known as:  ALTACE Take 1 capsule (10 mg total) daily by mouth. What changed:  when to take this            Durable Medical Equipment  (From admission, onward)        Start     Ordered   08/14/17 1455  DME 3 n 1  Once     08/14/17 1454   08/14/17 1455  DME Walker rolling  Once    Question:  Patient needs a walker to treat with the following condition  Answer:  Primary localized osteoarthritis of right knee   08/14/17 1454      FOLLOW UP VISIT:   Follow-up Information    Frederico Hamman, MD. Schedule an appointment as soon as possible for a visit in 2 week(s).   Specialty:  Orthopedic Surgery Contact information: [address] [place] [address] [phone]        [facility] Follow up.   Specialty:  Home Health Services Why:  Home Health Physical Therapy-agency will call to arrange initial appointment Contact information: [address] [place] [address] [place] [address] [phone]           DISPOSITION: HOME VS. SNF  CONDITION:  Meriel Pica 08/16/2017, 8:49 AM

## 2017-08-17 ENCOUNTER — Encounter (HOSPITAL_COMMUNITY): Payer: Self-pay | Admitting: Orthopedic Surgery

## 2017-10-12 ENCOUNTER — Ambulatory Visit: Payer: Self-pay | Admitting: Physician Assistant

## 2017-10-12 NOTE — H&P (View-Only) (Signed)
Steven Dixon is an 76 y.o. male.   Chief Complaint: stiffness s/p right total knee HPI: This is a 76-year-old male who is again, 9-10 weeks out for his right total knee replacement.  Overall, the pain is continuing to improve.  He is still fighting some stiffness with his flexion.  He stated the therapist saw 85 degrees of active and then was able to push him to 95 degrees passively, lacking about 5 degrees of full extension.  Past Medical History:  Diagnosis Date  . Arthritis   . Cardiomyopathy (HCC)   . Coronary artery disease   . Hyperlipidemia   . Hypertension   . MI (mitral incompetence)   . Myocardial infarction (HCC)   . Pre-diabetes   . Primary localized osteoarthritis of knee    Right    Past Surgical History:  Procedure Laterality Date  . APPENDECTOMY    . CARDIAC CATHETERIZATION     Stents X1  . CATARACT EXTRACTION    . CORONARY STENT PLACEMENT    . EYE SURGERY     Bilateral cataracts  . JOINT REPLACEMENT    . KNEE SURGERY    . ROTATOR CUFF REPAIR    . TOTAL KNEE ARTHROPLASTY Right 08/14/2017  . TOTAL KNEE ARTHROPLASTY Right 08/14/2017   Procedure: RIGHT TOTAL KNEE ARTHROPLASTY;  Surgeon: Caffrey, Daniel, MD;  Location: [facility] OR;  Service: Orthopedics;  Laterality: Right;    Family History  Problem Relation Age of Onset  . Heart disease Mother   . Heart disease Father   . Rheum arthritis Father    Social History:  reports that he has quit smoking. He has never used smokeless tobacco. He reports that he does not drink alcohol or use drugs.  Allergies: No Known Allergies   (Not in a hospital admission)  No results found for this or any previous visit (from the past 48 hour(s)). No results found.  Review of Systems  Genitourinary: Positive for frequency and urgency.  Musculoskeletal: Positive for joint pain.  Endo/Heme/Allergies: Bruises/bleeds easily.  All other systems reviewed and are negative.   There were no vitals taken for this visit. Physical  Exam  Constitutional: He is oriented to person, place, and time. He appears well-developed and well-nourished. No distress.  HENT:  Head: Normocephalic and atraumatic.  Eyes: Pupils are equal, round, and reactive to light. Conjunctivae and EOM are normal.  Neck: Normal range of motion. Neck supple.  Cardiovascular: Normal rate and intact distal pulses.  Respiratory: Effort normal. No respiratory distress.  GI: Soft. He exhibits no distension. There is no tenderness.  Musculoskeletal:       Right knee: He exhibits decreased range of motion and swelling. Tenderness found.  Neurological: He is alert and oriented to person, place, and time.  Skin: Skin is warm and dry. No rash noted. No erythema.  Psychiatric: He has a normal mood and affect. His behavior is normal.     Assessment/Plan Arthrofibrosis right total knee  Discussed risks benefits of closed manipulation right knee under anesthesia and patient wishes to proceed.  We will set this up as soon as practicable.  Plan on early rehab POD#1 with aggressive ROM.    Xaviar Lunn, PA-C 10/12/2017, 4:52 PM   

## 2017-10-12 NOTE — H&P (Signed)
Steven Dixon is an 77 y.o. male.   Chief Complaint: stiffness s/p right total knee HPI: This is a 77 year old male who is again, 9-10 weeks out for his right total knee replacement.  Overall, the pain is continuing to improve.  He is still fighting some stiffness with his flexion.  He stated the therapist saw 85 degrees of active and then was able to push him to 95 degrees passively, lacking about 5 degrees of full extension.  Past Medical History:  Diagnosis Date  . Arthritis   . Cardiomyopathy (HCC)   . Coronary artery disease   . Hyperlipidemia   . Hypertension   . MI (mitral incompetence)   . Myocardial infarction (HCC)   . Pre-diabetes   . Primary localized osteoarthritis of knee    Right    Past Surgical History:  Procedure Laterality Date  . APPENDECTOMY    . CARDIAC CATHETERIZATION     Stents X1  . CATARACT EXTRACTION    . CORONARY STENT PLACEMENT    . EYE SURGERY     Bilateral cataracts  . JOINT REPLACEMENT    . KNEE SURGERY    . ROTATOR CUFF REPAIR    . TOTAL KNEE ARTHROPLASTY Right 08/14/2017  . TOTAL KNEE ARTHROPLASTY Right 08/14/2017   Procedure: RIGHT TOTAL KNEE ARTHROPLASTY;  Surgeon: Frederico Hamman, MD;  Location: [facility];  Service: Orthopedics;  Laterality: Right;    Family History  Problem Relation Age of Onset  . Heart disease Mother   . Heart disease Father   . Rheum arthritis Father    Social History:  reports that he has quit smoking. He has never used smokeless tobacco. He reports that he does not drink alcohol or use drugs.  Allergies: No Known Allergies   (Not in a hospital admission)  No results found for this or any previous visit (from the past 48 hour(s)). No results found.  Review of Systems  Genitourinary: Positive for frequency and urgency.  Musculoskeletal: Positive for joint pain.  Endo/Heme/Allergies: Bruises/bleeds easily.  All other systems reviewed and are negative.   There were no vitals taken for this visit. Physical  Exam  Constitutional: He is oriented to person, place, and time. He appears well-developed and well-nourished. No distress.  HENT:  Head: Normocephalic and atraumatic.  Eyes: Pupils are equal, round, and reactive to light. Conjunctivae and EOM are normal.  Neck: Normal range of motion. Neck supple.  Cardiovascular: Normal rate and intact distal pulses.  Respiratory: Effort normal. No respiratory distress.  GI: Soft. He exhibits no distension. There is no tenderness.  Musculoskeletal:       Right knee: He exhibits decreased range of motion and swelling. Tenderness found.  Neurological: He is alert and oriented to person, place, and time.  Skin: Skin is warm and dry. No rash noted. No erythema.  Psychiatric: He has a normal mood and affect. His behavior is normal.     Assessment/Plan Arthrofibrosis right total knee  Discussed risks benefits of closed manipulation right knee under anesthesia and patient wishes to proceed.  We will set this up as soon as practicable.  Plan on early rehab POD#1 with aggressive ROM.    Margart Sickles, PA-C 10/12/2017, 4:52 PM

## 2017-10-13 ENCOUNTER — Encounter (HOSPITAL_BASED_OUTPATIENT_CLINIC_OR_DEPARTMENT_OTHER): Payer: Self-pay | Admitting: *Deleted

## 2017-10-13 ENCOUNTER — Other Ambulatory Visit: Payer: Self-pay

## 2017-10-13 ENCOUNTER — Encounter (HOSPITAL_BASED_OUTPATIENT_CLINIC_OR_DEPARTMENT_OTHER)
Admission: RE | Admit: 2017-10-13 | Discharge: 2017-10-13 | Disposition: A | Discharge status: Home / Self Care | Payer: Medicare Other | Source: Ambulatory Visit | Attending: Orthopedic Surgery | Admitting: Orthopedic Surgery

## 2017-10-13 DIAGNOSIS — I255 Ischemic cardiomyopathy: Secondary | ICD-10-CM | POA: Diagnosis not present

## 2017-10-13 DIAGNOSIS — I2510 Atherosclerotic heart disease of native coronary artery without angina pectoris: Secondary | ICD-10-CM | POA: Diagnosis not present

## 2017-10-13 DIAGNOSIS — E785 Hyperlipidemia, unspecified: Secondary | ICD-10-CM | POA: Diagnosis not present

## 2017-10-13 DIAGNOSIS — I10 Essential (primary) hypertension: Secondary | ICD-10-CM | POA: Diagnosis not present

## 2017-10-13 DIAGNOSIS — I340 Nonrheumatic mitral (valve) insufficiency: Secondary | ICD-10-CM | POA: Diagnosis not present

## 2017-10-13 DIAGNOSIS — Z7982 Long term (current) use of aspirin: Secondary | ICD-10-CM | POA: Diagnosis not present

## 2017-10-13 DIAGNOSIS — Z87891 Personal history of nicotine dependence: Secondary | ICD-10-CM | POA: Diagnosis not present

## 2017-10-13 DIAGNOSIS — Z8249 Family history of ischemic heart disease and other diseases of the circulatory system: Secondary | ICD-10-CM | POA: Diagnosis not present

## 2017-10-13 DIAGNOSIS — I252 Old myocardial infarction: Secondary | ICD-10-CM | POA: Diagnosis not present

## 2017-10-13 DIAGNOSIS — G8918 Other acute postprocedural pain: Secondary | ICD-10-CM | POA: Diagnosis not present

## 2017-10-13 DIAGNOSIS — Z96651 Presence of right artificial knee joint: Secondary | ICD-10-CM | POA: Diagnosis not present

## 2017-10-13 DIAGNOSIS — M24661 Ankylosis, right knee: Secondary | ICD-10-CM | POA: Diagnosis not present

## 2017-10-13 DIAGNOSIS — Z955 Presence of coronary angioplasty implant and graft: Secondary | ICD-10-CM | POA: Diagnosis not present

## 2017-10-13 DIAGNOSIS — Z79899 Other long term (current) drug therapy: Secondary | ICD-10-CM | POA: Diagnosis not present

## 2017-10-13 DIAGNOSIS — E119 Type 2 diabetes mellitus without complications: Secondary | ICD-10-CM | POA: Diagnosis not present

## 2017-10-13 LAB — BASIC METABOLIC PANEL
Anion gap: 11 (ref 5–15)
BUN: 17 mg/dL (ref 6–20)
CHLORIDE: 104 mmol/L (ref 101–111)
CO2: 24 mmol/L (ref 22–32)
Calcium: 9.3 mg/dL (ref 8.9–10.3)
Creatinine, Ser: 1.28 mg/dL — ABNORMAL HIGH (ref 0.61–1.24)
GFR calc Af Amer: >60 mL/min (ref >60)
GFR, EST NON AFRICAN AMERICAN: 53 mL/min — AB (ref >60)
GLUCOSE: 139 mg/dL — AB (ref 65–99)
POTASSIUM: 4.1 mmol/L (ref 3.5–5.1)
SODIUM: 139 mmol/L (ref 135–145)

## 2017-10-13 NOTE — Progress Notes (Addendum)
Plans to come today for BMET. Bring all medications. Dr. Mal Amabile Reviewed previous stress test, and notes - ok for surgery.

## 2017-10-14 ENCOUNTER — Other Ambulatory Visit: Payer: Self-pay

## 2017-10-14 ENCOUNTER — Ambulatory Visit (HOSPITAL_BASED_OUTPATIENT_CLINIC_OR_DEPARTMENT_OTHER): Payer: Medicare Other | Admitting: Anesthesiology

## 2017-10-14 ENCOUNTER — Encounter (HOSPITAL_BASED_OUTPATIENT_CLINIC_OR_DEPARTMENT_OTHER): Payer: Self-pay | Admitting: Anesthesiology

## 2017-10-14 ENCOUNTER — Encounter (HOSPITAL_BASED_OUTPATIENT_CLINIC_OR_DEPARTMENT_OTHER)
Admission: RE | Disposition: A | Discharge status: Home / Self Care | Payer: Self-pay | Source: Ambulatory Visit | Attending: Orthopedic Surgery

## 2017-10-14 ENCOUNTER — Ambulatory Visit (HOSPITAL_BASED_OUTPATIENT_CLINIC_OR_DEPARTMENT_OTHER)
Admission: RE | Admit: 2017-10-14 | Discharge: 2017-10-14 | Disposition: A | Discharge status: Home / Self Care | Payer: Medicare Other | Source: Ambulatory Visit | Attending: Orthopedic Surgery | Admitting: Orthopedic Surgery

## 2017-10-14 DIAGNOSIS — M24661 Ankylosis, right knee: Secondary | ICD-10-CM | POA: Insufficient documentation

## 2017-10-14 DIAGNOSIS — I2510 Atherosclerotic heart disease of native coronary artery without angina pectoris: Secondary | ICD-10-CM | POA: Insufficient documentation

## 2017-10-14 DIAGNOSIS — Z955 Presence of coronary angioplasty implant and graft: Secondary | ICD-10-CM | POA: Insufficient documentation

## 2017-10-14 DIAGNOSIS — I10 Essential (primary) hypertension: Secondary | ICD-10-CM | POA: Diagnosis not present

## 2017-10-14 DIAGNOSIS — I340 Nonrheumatic mitral (valve) insufficiency: Secondary | ICD-10-CM | POA: Insufficient documentation

## 2017-10-14 DIAGNOSIS — Z7982 Long term (current) use of aspirin: Secondary | ICD-10-CM | POA: Insufficient documentation

## 2017-10-14 DIAGNOSIS — Z96651 Presence of right artificial knee joint: Secondary | ICD-10-CM | POA: Insufficient documentation

## 2017-10-14 DIAGNOSIS — E119 Type 2 diabetes mellitus without complications: Secondary | ICD-10-CM | POA: Diagnosis not present

## 2017-10-14 DIAGNOSIS — I252 Old myocardial infarction: Secondary | ICD-10-CM | POA: Insufficient documentation

## 2017-10-14 DIAGNOSIS — G8918 Other acute postprocedural pain: Secondary | ICD-10-CM | POA: Insufficient documentation

## 2017-10-14 DIAGNOSIS — Z8249 Family history of ischemic heart disease and other diseases of the circulatory system: Secondary | ICD-10-CM | POA: Insufficient documentation

## 2017-10-14 DIAGNOSIS — Z87891 Personal history of nicotine dependence: Secondary | ICD-10-CM | POA: Insufficient documentation

## 2017-10-14 DIAGNOSIS — I255 Ischemic cardiomyopathy: Secondary | ICD-10-CM | POA: Insufficient documentation

## 2017-10-14 DIAGNOSIS — E785 Hyperlipidemia, unspecified: Secondary | ICD-10-CM | POA: Insufficient documentation

## 2017-10-14 DIAGNOSIS — Z79899 Other long term (current) drug therapy: Secondary | ICD-10-CM | POA: Insufficient documentation

## 2017-10-14 HISTORY — PX: KNEE CLOSED REDUCTION: SHX995

## 2017-10-14 SURGERY — MANIPULATION, KNEE, CLOSED
Anesthesia: General | Site: Knee | Laterality: Right

## 2017-10-14 MED ORDER — FENTANYL CITRATE (PF) 100 MCG/2ML IJ SOLN
INTRAMUSCULAR | Status: AC
  Filled 2017-10-14: qty 2

## 2017-10-14 MED ORDER — LACTATED RINGERS IV SOLN
INTRAVENOUS | Status: DC
  Administered 2017-10-14: 11:00:00 via INTRAVENOUS

## 2017-10-14 MED ORDER — PROPOFOL 500 MG/50ML IV EMUL
INTRAVENOUS | Status: DC | PRN
  Administered 2017-10-14: 20 ug via INTRAVENOUS
  Administered 2017-10-14: 50 ug via INTRAVENOUS
  Administered 2017-10-14: 20 ug via INTRAVENOUS
  Administered 2017-10-14: 50 ug via INTRAVENOUS

## 2017-10-14 MED ORDER — FENTANYL CITRATE (PF) 100 MCG/2ML IJ SOLN
50.0000 ug | INTRAMUSCULAR | Status: DC | PRN
  Administered 2017-10-14 (×2): 50 ug via INTRAVENOUS

## 2017-10-14 MED ORDER — FENTANYL CITRATE (PF) 100 MCG/2ML IJ SOLN
25.0000 ug | INTRAMUSCULAR | Status: DC | PRN
  Administered 2017-10-14 (×2): 50 ug via INTRAVENOUS

## 2017-10-14 MED ORDER — OXYCODONE HCL 5 MG PO TABS: ORAL_TABLET | ORAL | 0 refills | Status: DC

## 2017-10-14 MED ORDER — MIDAZOLAM HCL 2 MG/2ML IJ SOLN
1.0000 mg | INTRAMUSCULAR | Status: DC | PRN
  Administered 2017-10-14: 1 mg via INTRAVENOUS

## 2017-10-14 MED ORDER — LIDOCAINE 2% (20 MG/ML) 5 ML SYRINGE
INTRAMUSCULAR | Status: DC | PRN
  Administered 2017-10-14: 60 mg via INTRAVENOUS

## 2017-10-14 MED ORDER — ROPIVACAINE HCL 7.5 MG/ML IJ SOLN
INTRAMUSCULAR | Status: DC | PRN
  Administered 2017-10-14: 20 mL via PERINEURAL

## 2017-10-14 MED ORDER — BUPIVACAINE-EPINEPHRINE 0.5% -1:200000 IJ SOLN
INTRAMUSCULAR | Status: DC | PRN
  Administered 2017-10-14: 20 mL

## 2017-10-14 MED ORDER — ONDANSETRON HCL 4 MG/2ML IJ SOLN: 4.0000 mg | Freq: Once | INTRAMUSCULAR | Status: DC | PRN

## 2017-10-14 MED ORDER — MIDAZOLAM HCL 2 MG/2ML IJ SOLN
INTRAMUSCULAR | Status: AC
  Filled 2017-10-14: qty 2

## 2017-10-14 MED ORDER — MEPERIDINE HCL 25 MG/ML IJ SOLN: 6.2500 mg | INTRAMUSCULAR | Status: DC | PRN

## 2017-10-14 MED ORDER — SCOPOLAMINE 1 MG/3DAYS TD PT72: 1.0000 | MEDICATED_PATCH | Freq: Once | TRANSDERMAL | Status: DC | PRN

## 2017-10-14 MED ORDER — ONDANSETRON HCL 4 MG/2ML IJ SOLN
INTRAMUSCULAR | Status: DC | PRN
  Administered 2017-10-14: 4 mg via INTRAVENOUS

## 2017-10-14 MED ORDER — HYDROCODONE-ACETAMINOPHEN 7.5-325 MG PO TABS: 1.0000 | ORAL_TABLET | Freq: Once | ORAL | Status: DC | PRN

## 2017-10-14 SURGICAL SUPPLY — 12 items
BANDAGE ADH SHEER 1  50/CT (GAUZE/BANDAGES/DRESSINGS) ×3 IMPLANT
DECANTER SPIKE VIAL GLASS SM (MISCELLANEOUS) IMPLANT
GAUZE SPONGE 4X4 12PLY STRL LF (GAUZE/BANDAGES/DRESSINGS) IMPLANT
GLOVE BIOGEL PI IND STRL 8 (GLOVE) ×2 IMPLANT
GLOVE BIOGEL PI INDICATOR 8 (GLOVE) ×4
NDL SAFETY ECLIPSE 18X1.5 (NEEDLE) ×1 IMPLANT
NEEDLE HYPO 18GX1.5 SHARP (NEEDLE) ×2
NEEDLE SPNL 22GX3.5 QUINCKE BK (NEEDLE) IMPLANT
PAD ALCOHOL SWAB (MISCELLANEOUS) ×6 IMPLANT
SWABSTICK POVIDONE IODINE SNGL (MISCELLANEOUS) IMPLANT
SYR CONTROL 10ML LL (SYRINGE) ×6 IMPLANT
TOWEL OR NON WOVEN STRL DISP B (DISPOSABLE) ×3 IMPLANT

## 2017-10-14 NOTE — Anesthesia Postprocedure Evaluation (Signed)
Anesthesia Post Note  Patient: Steven Dixon  Procedure(s) Performed: CLOSED MANIPULATION RIGHT KNEE (Right Knee)     Patient location during evaluation: PACU Anesthesia Type: General Level of consciousness: awake and alert and oriented Pain management: pain level controlled Vital Signs Assessment: post-procedure vital signs reviewed and stable Respiratory status: spontaneous breathing, nonlabored ventilation and respiratory function stable Cardiovascular status: blood pressure returned to baseline and stable Postop Assessment: no apparent nausea or vomiting Anesthetic complications: no    Last Vitals:  Vitals:   10/14/17 1215 10/14/17 1231  BP: 138/72 (!) 156/94  Pulse: 65 66  Resp: 18 15  Temp:    SpO2: 96% 95%    Last Pain:  Vitals:   10/14/17 1231  TempSrc:   PainSc: 4         RLE Motor Response: Purposeful movement (10/14/17 1246) RLE Sensation: Full sensation (10/14/17 1246)      Shonteria Abeln A.

## 2017-10-14 NOTE — Brief Op Note (Signed)
10/14/2017  12:28 PM  PATIENT:  Steven Dixon  77 y.o. male  PRE-OPERATIVE DIAGNOSIS:  right knee ankylosis  POST-OPERATIVE DIAGNOSIS:  right knee ankylosis  PROCEDURE:  Procedure(s): CLOSED MANIPULATION RIGHT KNEE (Right)  SURGEON:  Surgeon(s) and Role:    Frederico Hamman, MD - Primary  PHYSICIAN ASSISTANT: Margart Sickles, PA-C  ASSISTANTS:    ANESTHESIA:   local, regional and IV sedation  EBL:  0 mL   BLOOD ADMINISTERED:none  DRAINS: none   LOCAL MEDICATIONS USED:  MARCAINE     SPECIMEN:  No Specimen  DISPOSITION OF SPECIMEN:  N/A  COUNTS:  YES  TOURNIQUET:  * No tourniquets in log *  DICTATION: .Other Dictation: Dictation Number unknown  PLAN OF CARE: Discharge to home after PACU  PATIENT DISPOSITION:  PACU - hemodynamically stable.   Delay start of Pharmacological VTE agent (>24hrs) due to surgical blood loss or risk of bleeding: not applicable

## 2017-10-14 NOTE — Discharge Instructions (Signed)
Post Anesthesia Home Care Instructions  Activity: Get plenty of rest for the remainder of the day. A responsible individual must stay with you for 24 hours following the procedure.  For the next 24 hours, DO NOT: -Drive a car -Advertising copywriter -Drink alcoholic beverages -Take any medication unless instructed by your physician -Make any legal decisions or sign important papers.  Meals: Start with liquid foods such as gelatin or soup. Progress to regular foods as tolerated. Avoid greasy, spicy, heavy foods. If nausea and/or vomiting occur, drink only clear liquids until the nausea and/or vomiting subsides. Call your physician if vomiting continues.  Special Instructions/Symptoms: Your throat may feel dry or sore from the anesthesia or the breathing tube placed in your throat during surgery. If this causes discomfort, gargle with warm salt water. The discomfort should disappear within 24 hours.  If you had a scopolamine patch placed behind your ear for the management of post- operative nausea and/or vomiting:  1. The medication in the patch is effective for 72 hours, after which it should be removed.  Wrap patch in a tissue and discard in the trash. Wash hands thoroughly with soap and water. 2. You may remove the patch earlier than 72 hours if you experience unpleasant side effects which may include dry mouth, dizziness or visual disturbances. 3. Avoid touching the patch. Wash your hands with soap and water after contact with the patch.     Regional Anesthesia Blocks  1. Numbness or the inability to move the "blocked" extremity may last from 3-48 hours after placement. The length of time depends on the medication injected and your individual response to the medication. If the numbness is not going away after 48 hours, call your surgeon.  2. The extremity that is blocked will need to be protected until the numbness is gone and the  Strength has returned. Because you cannot feel it, you  will need to take extra care to avoid injury. Because it may be weak, you may have difficulty moving it or using it. You may not know what position it is in without looking at it while the block is in effect.  3. For blocks in the legs and feet, returning to weight bearing and walking needs to be done carefully. You will need to wait until the numbness is entirely gone and the strength has returned. You should be able to move your leg and foot normally before you try and bear weight or walk. You will need someone to be with you when you first try to ensure you do not fall and possibly risk injury.  4. Bruising and tenderness at the needle site are common side effects and will resolve in a few days.  5. Persistent numbness or new problems with movement should be communicated to the surgeon or the [facility] [phone] [facility] [phone]).   Diet: As you were doing prior to hospitalization   Activity: Increase activity slowly as tolerated  No lifting or driving for 24 hours  Shower: May shower    Dressing: N/A   Weight Bearing: weight bearing as tolerated.  Encourage early Range of motion with PT, CPM machine or on your own.    To prevent constipation: you may use a stool softener such as -  Colace ( over the counter) 100 mg by mouth twice a day  Drink plenty of fluids ( prune juice may be helpful) and high fiber foods  Miralax ( over the counter) for constipation  as needed.   Precautions: If you experience chest pain or shortness of breath - call 911 immediately For transfer to the hospital emergency department!!  If you develop a fever greater that 101 F, purulent drainage from wound, increased redness or drainage from wound, or calf pain -- Call the office   Follow- Up Appointment: Please call for an appointment to be seen next week [facility] - [phone]

## 2017-10-14 NOTE — Interval H&P Note (Signed)
History and Physical Interval Note:  10/14/2017 11:43 AM  Steven Dixon  has presented today for surgery, with the diagnosis of right knee ankylosis  The various methods of treatment have been discussed with the patient and family. After consideration of risks, benefits and other options for treatment, the patient has consented to  Procedure(s): CLOSED MANIPULATION RIGHT KNEE (Right) as a surgical intervention .  The patient's history has been reviewed, patient examined, no change in status, stable for surgery.  I have reviewed the patient's chart and labs.  Questions were answered to the patient's satisfaction.     Thera Flake

## 2017-10-14 NOTE — Transfer of Care (Signed)
Immediate Anesthesia Transfer of Care Note  Patient: Steven Dixon  Procedure(s) Performed: CLOSED MANIPULATION RIGHT KNEE (Right Knee)  Patient Location: PACU  Anesthesia Type:MAC  Level of Consciousness: awake, alert  and oriented  Airway & Oxygen Therapy: Patient Spontanous Breathing and Patient connected to face mask oxygen  Post-op Assessment: Report given to RN and Post -op Vital signs reviewed and stable  Post vital signs: Reviewed and stable  Last Vitals:  Vitals Value Taken Time  BP 138/82 10/14/2017 12:10 PM  Temp    Pulse 68 10/14/2017 12:14 PM  Resp 19 10/14/2017 12:14 PM  SpO2 96 % 10/14/2017 12:14 PM  Vitals shown include unvalidated device data.  Last Pain:  Vitals:   10/14/17 1020  TempSrc: Oral  PainSc: 2       Patients Stated Pain Goal: 3 (71/69/67 8938)  Complications: No apparent anesthesia complications

## 2017-10-14 NOTE — Anesthesia Procedure Notes (Signed)
Anesthesia Regional Block: Adductor canal block   Pre-Anesthetic Checklist: ,, timeout performed, Correct Patient, Correct Site, Correct Laterality, Correct Procedure, Correct Position, site marked, Risks and benefits discussed,  Surgical consent,  Pre-op evaluation,  At surgeon's request and post-op pain management  Laterality: Right  Prep: chloraprep       Needles:  Injection technique: Single-shot  Needle Type: Echogenic Stimulator Needle     Needle Length: 10cm  Needle Gauge: 21   Needle insertion depth: 4 cm   Additional Needles:   Procedures:,,,, ultrasound used (permanent image in chart),,,,  Narrative:  Start time: 10/14/2017 12:04 PM End time: 10/14/2017 12:08 PM Injection made incrementally with aspirations every 5 mL.  Performed by: Personally  Anesthesiologist: Mal Amabile, MD  Additional Notes: Timeout performed. Patient sedated. Relevant anatomy ID'd using [place]. Incremental 2-70ml injection of LA with frequent aspiration. Patient tolerated procedure well.        Right Adductor Canal Block

## 2017-10-14 NOTE — Anesthesia Preprocedure Evaluation (Addendum)
Anesthesia Evaluation  Patient identified by MRN, date of birth, ID band Patient awake    Reviewed: Allergy & Precautions, NPO status , Patient's Chart, lab work & pertinent test results  Airway Mallampati: II  TM Distance: >3 FB Neck ROM: Full    Dental no notable dental hx. (+) Teeth Intact   Pulmonary former smoker,    Pulmonary exam normal breath sounds clear to auscultation       Cardiovascular hypertension, Pt. on medications and Pt. on home beta blockers + CAD, + Past MI and + Cardiac Stents  Normal cardiovascular exam+ Valvular Problems/Murmurs MR  Rhythm:Regular Rate:Normal  Ischemic CM LVEF 41% Echo 07/16/2017-Left ventricle: Global longitudinal LV strain is abnormal at-12%   The cavity size was mildly dilated. Systolic function was mildly  to moderately reduced. The estimated ejection fraction was in the  range of 40% to 45%. There is akinesis of the apicalanterior,  lateral, inferior, and apical myocardium. There is akinesis of the midanteroseptal myocardium. There was an increased relative   contribution of atrial contraction to ventricular filling.   Doppler parameters are consistent with abnormal left ventricular   relaxation (grade 1 diastolic dysfunction). Doppler parameters   are consistent with high ventricular filling pressure. - Aortic valve: Trileaflet; mildly thickened, mildly calcified   leaflets. There was trivial regurgitation. - Mitral valve: There was trivial regurgitation. - Pulmonary arteries: Systolic pressure could not be accurately  Estimated.  EKG 05/13/2017- Sinus bradycardia, anteroseptal MI  Myocardial perfusion imaging 07/14/2017  Nuclear stress EF: 41%.  There was no ST segment deviation noted during stress.  Defect 1: There is a medium defect of severe severity present in the mid anterior, apical anterior and apex location.  This is an intermediate risk study.  The left ventricular  ejection fraction is moderately decreased (30-44%).  Findings consistent with prior myocardial infarction.   There is a medium size, severe, non-reversible defect in the mid and apical anterior walls and in the true apex consistent with an infarct in the mid LAD territory with no peri-infarct ischemia.     Neuro/Psych negative neurological ROS  negative psych ROS   GI/Hepatic negative GI ROS, Neg liver ROS,   Endo/Other  diabetes, Well Controlled, Type 2Hyperlipidemia  Renal/GU Renal InsufficiencyRenal disease  negative genitourinary   Musculoskeletal  (+) Arthritis , Osteoarthritis,  Ankylosis right knee S/P Right TKR   Abdominal   Peds  Hematology  (+) anemia ,   Anesthesia Other Findings   Reproductive/Obstetrics                           Anesthesia Physical Anesthesia Plan  ASA: III  Anesthesia Plan: General   Post-op Pain Management:    Induction: Intravenous  PONV Risk Score and Plan: Ondansetron, Dexamethasone, Treatment may vary due to age or medical condition and Propofol infusion  Airway Management Planned: Mask  Additional Equipment:   Intra-op Plan:   Post-operative Plan:   Informed Consent: I have reviewed the patients History and Physical, chart, labs and discussed the procedure including the risks, benefits and alternatives for the proposed anesthesia with the patient or authorized representative who has indicated his/her understanding and acceptance.   Dental advisory given  Plan Discussed with: CRNA, Anesthesiologist and Surgeon  Anesthesia Plan Comments:         Anesthesia Quick Evaluation

## 2017-10-15 ENCOUNTER — Encounter (HOSPITAL_BASED_OUTPATIENT_CLINIC_OR_DEPARTMENT_OTHER): Payer: Self-pay | Admitting: Orthopedic Surgery

## 2017-10-15 NOTE — Op Note (Signed)
NAME:  VAIL, ARMOR NO.:  [id number]  MEDICAL RECORD NO.:  [id number]  LOCATION:                                 FACILITY:  PHYSICIAN:  Dyke Brackett, M.D.         DATE OF BIRTH:  DATE OF PROCEDURE:  10/14/2017 DATE OF DISCHARGE:                              OPERATIVE REPORT   PREOPERATIVE DIAGNOSIS:  Arthrofibrosis of right knee, status post right total knee replacement.  POSTOPERATIVE DIAGNOSIS:  Arthrofibrosis of right knee, status post right total knee replacement.  OPERATIONS: 1. Manipulation under anesthesia. 2. Injection of local anesthetic.  SURGEON:  Dyke Brackett, M.D.  ANESTHESIA:  General with a nerve block with local.  DESCRIPTION OF PROCEDURE:  Under general anesthetic, the patient's range of motion was noted to be 10 to about, 80 post manipulation close to full extension, probably 2-3 degrees lacking full extension, but flexed at least to 120.  We did inject about 15 mL of 0.5% Marcaine intra- articularly followed by anesthesiologist was going to perform an adductor block.  Followup plan for the patient to be seen tomorrow in PT for aggressive PT, did emphasize flexion and extension.     Dyke Brackett, M.D.   ______________________________ Dyke Brackett, M.D.    WDC/MEDQ  D:  10/14/2017  T:  10/14/2017  Job:  [id number]

## 2018-02-22 ENCOUNTER — Encounter: Payer: Self-pay | Admitting: Cardiology

## 2018-02-22 ENCOUNTER — Ambulatory Visit (INDEPENDENT_AMBULATORY_CARE_PROVIDER_SITE_OTHER): Payer: Medicare Other | Admitting: Cardiology

## 2018-02-22 VITALS — BP 140/80 | HR 68 | Resp 14 | Ht 69.5 in | Wt 195.8 lb

## 2018-02-22 DIAGNOSIS — I10 Essential (primary) hypertension: Secondary | ICD-10-CM

## 2018-02-22 DIAGNOSIS — I252 Old myocardial infarction: Secondary | ICD-10-CM

## 2018-02-22 DIAGNOSIS — I2510 Atherosclerotic heart disease of native coronary artery without angina pectoris: Secondary | ICD-10-CM

## 2018-02-22 DIAGNOSIS — I255 Ischemic cardiomyopathy: Secondary | ICD-10-CM

## 2018-02-22 DIAGNOSIS — E785 Hyperlipidemia, unspecified: Secondary | ICD-10-CM

## 2018-02-22 NOTE — Progress Notes (Signed)
Cardiology Office Note:    Date:  02/22/2018   ID:  Steven Dixon, DOB 13-Feb-1941, MRN [id number]  PCP:  Raynelle Jan., MD  Cardiologist:  Gypsy Balsam, MD    Referring MD: Raynelle Jan., MD   Chief Complaint  Patient presents with  . Follow-up  Doing well  History of Present Illness:    Steven Dixon is a 77 y.o. male with coronary artery disease, ischemic cardiomyopathy ejection fraction 40 to 45%.  He did have a right knee surgery replacement done months ago.  Looking long-term recovery over how she is very happy and he is thinking about doing his second knee.  He asked me if it is okay to proceed with the surgery asymptomatic no chest pain tightness squeezing pressure burning chest can walk and climb stairs with some pain in the knee but otherwise doing well he did have a stress test done in January which was negative.  Denies having any new issue and actually his exercise capacity is better now that it was before his first knee surgery.  Past Medical History:  Diagnosis Date  . Arthritis   . Cardiomyopathy (HCC)   . Coronary artery disease    has 2 stents in heart- [facility]  . Hyperlipidemia   . Hypertension   . MI (mitral incompetence)   . Myocardial infarction (HCC)   . Pre-diabetes   . Primary localized osteoarthritis of knee    Right    Past Surgical History:  Procedure Laterality Date  . APPENDECTOMY    . CARDIAC CATHETERIZATION     Stents X1  . CATARACT EXTRACTION    . CORONARY STENT PLACEMENT    . EYE SURGERY     Bilateral cataracts  . JOINT REPLACEMENT    . KNEE CLOSED REDUCTION Right 10/14/2017   Procedure: CLOSED MANIPULATION RIGHT KNEE;  Surgeon: Frederico Hamman, MD;  Location: [facility];  Service: Orthopedics;  Laterality: Right;  . KNEE SURGERY    . ROTATOR CUFF REPAIR    . TOTAL KNEE ARTHROPLASTY Right 08/14/2017  . TOTAL KNEE ARTHROPLASTY Right 08/14/2017   Procedure: RIGHT TOTAL KNEE ARTHROPLASTY;   Surgeon: Frederico Hamman, MD;  Location: [facility] OR;  Service: Orthopedics;  Laterality: Right;    Current Medications: Current Meds  Medication Sig  . aspirin EC 81 MG tablet Take 81 mg by mouth daily.  . carvedilol (COREG) 6.25 MG tablet Take 1 tablet (6.25 mg total) 2 (two) times daily by mouth.  . cholecalciferol (VITAMIN D) 1000 units tablet Take 1,000 Units by mouth daily.  . nitroGLYCERIN (NITROSTAT) 0.4 MG SL tablet Place 0.4 mg under the tongue as needed for chest pain.  . pravastatin (PRAVACHOL) 40 MG tablet Take 1 tablet (40 mg total) every evening by mouth. (Patient taking differently: Take 40 mg by mouth at bedtime. Midnight)  . ramipril (ALTACE) 10 MG capsule Take 1 capsule (10 mg total) daily by mouth. (Patient taking differently: Take 10 mg by mouth daily at 3 pm. )     Allergies:   Patient has no known allergies.   Social History   Socioeconomic History  . Marital status: Married    Spouse name: Not on file  . Number of children: Not on file  . Years of education: Not on file  . Highest education level: Not on file  Occupational History  . Not on file  Social Needs  . Financial resource strain: Not on file  . Food insecurity:  Worry: Not on file    Inability: Not on file  . Transportation needs:    Medical: Not on file    Non-medical: Not on file  Tobacco Use  . Smoking status: Former Games developer  . Smokeless tobacco: Never Used  Substance and Sexual Activity  . Alcohol use: No  . Drug use: No  . Sexual activity: Not on file  Lifestyle  . Physical activity:    Days per week: 3 days    Minutes per session: 30 min  . Stress: Not at all  Relationships  . Social connections:    Talks on phone: Not on file    Gets together: Not on file    Attends religious service: Not on file    Active member of club or organization: Not on file    Attends meetings of clubs or organizations: Not on file    Relationship status: Not on file  Other Topics Concern  . Not on  file  Social History Narrative  . Not on file     Family History: The patient's family history includes Heart disease in his father and mother; Rheum arthritis in his father. ROS:   Please see the history of present illness.    All 14 point review of systems negative except as described per history of present illness  EKGs/Labs/Other Studies Reviewed:      Recent Labs: 08/03/2017: ALT 16 08/16/2017: Hemoglobin 11.8; Platelets 229 10/13/2017: BUN 17; Creatinine, Ser 1.28; Potassium 4.1; Sodium 139  Recent Lipid Panel No results found for: CHOL, TRIG, HDL, CHOLHDL, VLDL, LDLCALC, LDLDIRECT  Physical Exam:    VS:  BP 140/80   Pulse 68   Resp 14   Ht 5' 9.5" (1.765 m)   Wt 195 lb 12.8 oz (88.8 kg)   BMI 28.50 kg/m     Wt Readings from Last 3 Encounters:  02/22/18 195 lb 12.8 oz (88.8 kg)  10/14/17 187 lb (84.8 kg)  08/14/17 190 lb (86.2 kg)     GEN:  Well nourished, well developed in no acute distress HEENT: Normal NECK: No JVD; No carotid bruits LYMPHATICS: No lymphadenopathy CARDIAC: RRR, no murmurs, no rubs, no gallops RESPIRATORY:  Clear to auscultation without rales, wheezing or rhonchi  ABDOMEN: Soft, non-tender, non-distended MUSCULOSKELETAL:  No edema; No deformity  SKIN: Warm and dry LOWER EXTREMITIES: no swelling NEUROLOGIC:  Alert and oriented x 3 PSYCHIATRIC:  Normal affect   ASSESSMENT:    1. Coronary artery disease involving native coronary artery of native heart without angina pectoris   2. Ischemic cardiomyopathy   3. Essential hypertension   4. Old MI (myocardial infarction)   5. Dyslipidemia    PLAN:    In order of problems listed above:  1. Coronary artery disease stress test reviewed from January no evidence of ischemia.  Old microinfarction 2. Ischemic cardiomyopathy.  Ejection fraction 4045%.  He is on ACE inhibitor as well as beta-blocker which I will continue.  Echocardiogram will be scheduled to reassess left ventricular ejection  fraction. 3. Essential hypertension: Blood pressure well controlled continue present management 4. Dyslipidemia I will ask him to have fasting lipid profile done he takes only moderate intensity statin he can benefit from high intensity statin however he does not want to change.  He did have some issue with intolerance to different statins before.   Medication Adjustments/Labs and Tests Ordered: Current medicines are reviewed at length with the patient today.  Concerns regarding medicines are outlined above.  No orders  of the defined types were placed in this encounter.  Medication changes: No orders of the defined types were placed in this encounter.   Signed, Georgeanna Lea, MD, [facility] 02/22/2018 3:38 PM    [facility]

## 2018-02-22 NOTE — Patient Instructions (Signed)
Medication Instructions:  Your physician recommends that you continue on your current medications as directed. Please refer to the Current Medication list given to you today.  Labwork: None  Testing/Procedures: Your physician has requested that you have an echocardiogram. Echocardiography is a painless test that uses sound waves to create images of your heart. It provides your doctor with information about the size and shape of your heart and how well your heart's chambers and valves are working. This procedure takes approximately one hour. There are no restrictions for this procedure.  Follow-Up: Your physician recommends that you schedule a follow-up appointment in: December, 2019  Any Other Special Instructions Will Be Listed Below (If Applicable).     If you need a refill on your cardiac medications before your next appointment, please call your pharmacy.   [facility]  Garey Ham, RN, BSN

## 2018-04-21 DIAGNOSIS — Z96651 Presence of right artificial knee joint: Secondary | ICD-10-CM | POA: Insufficient documentation

## 2018-04-21 HISTORY — DX: Presence of right artificial knee joint: Z96.651

## 2018-04-29 ENCOUNTER — Other Ambulatory Visit: Payer: Self-pay | Admitting: Cardiology

## 2018-05-11 ENCOUNTER — Ambulatory Visit (INDEPENDENT_AMBULATORY_CARE_PROVIDER_SITE_OTHER): Payer: Medicare Other

## 2018-05-11 ENCOUNTER — Other Ambulatory Visit: Payer: Self-pay

## 2018-05-11 DIAGNOSIS — I2510 Atherosclerotic heart disease of native coronary artery without angina pectoris: Secondary | ICD-10-CM | POA: Diagnosis not present

## 2018-05-11 DIAGNOSIS — I255 Ischemic cardiomyopathy: Secondary | ICD-10-CM

## 2018-05-11 NOTE — Progress Notes (Signed)
Complete echocardiogram has been performed.  Jimmy Makaylin Carlo RDCS, RVT 

## 2018-05-25 ENCOUNTER — Ambulatory Visit (INDEPENDENT_AMBULATORY_CARE_PROVIDER_SITE_OTHER): Payer: Medicare Other | Admitting: Cardiology

## 2018-05-25 ENCOUNTER — Encounter: Payer: Self-pay | Admitting: Cardiology

## 2018-05-25 ENCOUNTER — Ambulatory Visit: Payer: Medicare Other | Admitting: Cardiology

## 2018-05-25 VITALS — BP 118/66 | HR 61 | Ht 69.5 in | Wt 199.8 lb

## 2018-05-25 DIAGNOSIS — I2510 Atherosclerotic heart disease of native coronary artery without angina pectoris: Secondary | ICD-10-CM | POA: Diagnosis not present

## 2018-05-25 DIAGNOSIS — I255 Ischemic cardiomyopathy: Secondary | ICD-10-CM

## 2018-05-25 DIAGNOSIS — E785 Hyperlipidemia, unspecified: Secondary | ICD-10-CM

## 2018-05-25 DIAGNOSIS — I10 Essential (primary) hypertension: Secondary | ICD-10-CM | POA: Diagnosis not present

## 2018-05-25 NOTE — Progress Notes (Signed)
Cardiology Office Note:    Date:  05/25/2018   ID:  Steven Dixon, DOB [birth year], MRN [id number]  PCP:  Raynelle Jan., MD  Cardiologist:  Gypsy Balsam, MD    Referring MD: Raynelle Jan., MD   Chief Complaint  Patient presents with  . Follow-up  Doing well asymptomatic  History of Present Illness:    Steven Dixon is a 77 y.o. 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  Past Medical History:  Diagnosis Date  . Arthritis   . Cardiomyopathy (HCC)   . Coronary artery disease    has 2 stents in heart- [facility]  . Hyperlipidemia   . Hypertension   . MI (mitral incompetence)   . Myocardial infarction (HCC)   . Pre-diabetes   . Primary localized osteoarthritis of knee    Right    Past Surgical History:  Procedure Laterality Date  . APPENDECTOMY    . CARDIAC CATHETERIZATION     Stents X1  . CATARACT EXTRACTION    . CORONARY STENT PLACEMENT    . EYE SURGERY     Bilateral cataracts  . JOINT REPLACEMENT    . KNEE CLOSED REDUCTION Right 10/14/2017   Procedure: CLOSED MANIPULATION RIGHT KNEE;  Surgeon: Frederico Hamman, MD;  Location: [facility];  Service: Orthopedics;  Laterality: Right;  . KNEE SURGERY    . ROTATOR CUFF REPAIR    . TOTAL KNEE ARTHROPLASTY Right 08/14/2017  . TOTAL KNEE ARTHROPLASTY Right 08/14/2017   Procedure: RIGHT TOTAL KNEE ARTHROPLASTY;  Surgeon: Frederico Hamman, MD;  Location: [facility] OR;  Service: Orthopedics;  Laterality: Right;    Current Medications: Current Meds  Medication Sig  . aspirin EC 81 MG tablet Take 81 mg by mouth daily.  . carvedilol (COREG) 6.25 MG tablet Take 1 tablet (6.25 mg total) 2 (two) times daily by mouth.  . cholecalciferol  (VITAMIN D) 1000 units tablet Take 1,000 Units by mouth daily.  . nitroGLYCERIN (NITROSTAT) 0.4 MG SL tablet Place 0.4 mg under the tongue as needed for chest pain.  . pravastatin (PRAVACHOL) 40 MG tablet Take 1 tablet (40 mg total) by mouth every evening. (Patient taking differently: Take 80 mg by mouth daily. )  . ramipril (ALTACE) 10 MG capsule Take 1 capsule daily by mouth.     Allergies:   Patient has no known allergies.   Social History   Socioeconomic History  . Marital status: Married    Spouse name: Not on file  . Number of children: Not on file  . Years of education: Not on file  . Highest education level: Not on file  Occupational History  . Not on file  Social Needs  . Financial resource strain: Not on file  . Food insecurity:    Worry: Not on file    Inability: Not on file  . Transportation needs:    Medical: Not on file    Non-medical: Not on file  Tobacco Use  . Smoking status: Former Games developer  . Smokeless tobacco: Never Used  Substance and Sexual Activity  . Alcohol use: No  . Drug use: No  . Sexual activity: Not on file  Lifestyle  . Physical activity:    Days per week:  3 days    Minutes per session: 30 min  . Stress: Not at all  Relationships  . Social connections:    Talks on phone: Not on file    Gets together: Not on file    Attends religious service: Not on file    Active member of club or organization: Not on file    Attends meetings of clubs or organizations: Not on file    Relationship status: Not on file  Other Topics Concern  . Not on file  Social History Narrative  . Not on file     Family History: The patient's family history includes Heart disease in his father and mother; Rheum arthritis in his father. ROS:   Please see the history of present illness.    All 14 point review of systems negative except as described per history of present illness  EKGs/Labs/Other Studies Reviewed:      Recent Labs: 08/03/2017: ALT 16 08/16/2017:  Hemoglobin 11.8; Platelets 229 10/13/2017: BUN 17; Creatinine, Ser 1.28; Potassium 4.1; Sodium 139  Recent Lipid Panel No results found for: CHOL, TRIG, HDL, CHOLHDL, VLDL, LDLCALC, LDLDIRECT  Physical Exam:    VS:  BP 118/66   Pulse 61   Ht 5' 9.5" (1.765 m)   Wt 199 lb 12.8 oz (90.6 kg)   SpO2 97%   BMI 29.08 kg/m     Wt Readings from Last 3 Encounters:  05/25/18 199 lb 12.8 oz (90.6 kg)  02/22/18 195 lb 12.8 oz (88.8 kg)  10/14/17 187 lb (84.8 kg)     GEN:  Well nourished, well developed in no acute distress HEENT: Normal NECK: No JVD; No carotid bruits LYMPHATICS: No lymphadenopathy CARDIAC: RRR, no murmurs, no rubs, no gallops RESPIRATORY:  Clear to auscultation without rales, wheezing or rhonchi  ABDOMEN: Soft, non-tender, non-distended MUSCULOSKELETAL:  No edema; No deformity  SKIN: Warm and dry LOWER EXTREMITIES: no swelling NEUROLOGIC:  Alert and oriented x 3 PSYCHIATRIC:  Normal affect   ASSESSMENT:    1. Coronary artery disease involving native coronary artery of native heart without angina pectoris   2. Ischemic cardiomyopathy   3. Essential hypertension   4. Dyslipidemia    PLAN:    In order of problems listed above:  1. Coronary artery disease doing well asymptomatic we will continue present management. 2. History of ischemic cardia myopathy latest estimation of ejection fraction 5055% he is on beta-blocker as well as ACE inhibitor which I will continue. 3. Essential hypertension blood pressure well controlled 4. Dyslipidemia cholesterol medication recently increased we will schedule him to have fasting lipid profile in 2 weeks.   Medication Adjustments/Labs and Tests Ordered: Current medicines are reviewed at length with the patient today.  Concerns regarding medicines are outlined above.  No orders of the defined types were placed in this encounter.  Medication changes: No orders of the defined types were placed in this  encounter.   Signed, Georgeanna Lea, MD, [facility] 05/25/2018 2:27 PM    [facility]

## 2018-05-25 NOTE — Patient Instructions (Signed)
Medication Instructions:  Your physician recommends that you continue on your current medications as directed. Please refer to the Current Medication list given to you today.  If you need a refill on your cardiac medications before your next appointment, please call your pharmacy.   Lab work: Your physician recommends that you return for lab work in 2 weeks: FASTING Lipids, lft If you have labs (blood work) drawn today and your tests are completely normal, you will receive your results only by: Marland Kitchen MyChart Message (if you have MyChart) OR . A paper copy in the mail If you have any lab test that is abnormal or we need to change your treatment, we will call you to review the results.  Testing/Procedures: None.   Follow-Up: At [facility], you and your health needs are our priority.  As part of our continuing mission to provide you with exceptional heart care, we have created designated Provider Care Teams.  These Care Teams include your primary Cardiologist (physician) and Advanced Practice Providers (APPs -  Physician Assistants and Nurse Practitioners) who all work together to provide you with the care you need, when you need it. You will need a follow up appointment in 6 months.  Please call our office 2 months in advance to schedule this appointment.  You may see Gypsy Balsam, MD or another member of our [facility] Provider Team in [place]: Norman Herrlich, MD . Belva Crome, MD  Any Other Special Instructions Will Be Listed Below (If Applicable).

## 2018-05-27 ENCOUNTER — Other Ambulatory Visit: Payer: Self-pay | Admitting: Cardiology

## 2018-05-28 ENCOUNTER — Other Ambulatory Visit: Payer: Self-pay | Admitting: Emergency Medicine

## 2018-05-28 MED ORDER — PRAVASTATIN SODIUM 40 MG PO TABS: 80.0000 mg | ORAL_TABLET | Freq: Every day | ORAL | 1 refills | Status: DC

## 2018-05-28 MED ORDER — RAMIPRIL 10 MG PO CAPS: ORAL_CAPSULE | ORAL | 1 refills | Status: DC

## 2018-06-10 ENCOUNTER — Telehealth: Payer: Self-pay | Admitting: Emergency Medicine

## 2018-06-10 MED ORDER — PRAVASTATIN SODIUM 80 MG PO TABS: 80.0000 mg | ORAL_TABLET | Freq: Every day | ORAL | 3 refills | Status: DC

## 2018-06-10 NOTE — Telephone Encounter (Signed)
Called pharmacy to inquire about pravastatin 80 mg daily. Per Pharmacy medication can be changed to 80 mg tablet and prior authorization will not be needed. Medication order changed.

## 2018-06-21 ENCOUNTER — Other Ambulatory Visit: Payer: Self-pay

## 2018-06-21 DIAGNOSIS — I255 Ischemic cardiomyopathy: Secondary | ICD-10-CM

## 2018-06-21 LAB — HEPATIC FUNCTION PANEL
ALBUMIN: 4.8 g/dL (ref 3.5–4.8)
ALT: 14 IU/L (ref 0–44)
AST: 13 IU/L (ref 0–40)
Alkaline Phosphatase: 69 IU/L (ref 39–117)
BILIRUBIN TOTAL: 0.5 mg/dL (ref 0.0–1.2)
Bilirubin, Direct: 0.17 mg/dL (ref 0.00–0.40)
Total Protein: 7.6 g/dL (ref 6.0–8.5)

## 2018-06-21 LAB — BASIC METABOLIC PANEL
BUN/Creatinine Ratio: 14 (ref 10–24)
BUN: 21 mg/dL (ref 8–27)
CO2: 23 mmol/L (ref 20–29)
CREATININE: 1.52 mg/dL — AB (ref 0.76–1.27)
Calcium: 9.5 mg/dL (ref 8.6–10.2)
Chloride: 100 mmol/L (ref 96–106)
GFR calc Af Amer: 51 mL/min/{1.73_m2} — ABNORMAL LOW (ref >59)
GFR calc non Af Amer: 44 mL/min/{1.73_m2} — ABNORMAL LOW (ref >59)
Glucose: 137 mg/dL — ABNORMAL HIGH (ref 65–99)
POTASSIUM: 4.1 mmol/L (ref 3.5–5.2)
SODIUM: 139 mmol/L (ref 134–144)

## 2018-06-21 LAB — LIPID PANEL
CHOL/HDL RATIO: 3.2 ratio (ref 0.0–5.0)
CHOLESTEROL TOTAL: 150 mg/dL (ref 100–199)
HDL: 47 mg/dL (ref >39)
LDL Calculated: 63 mg/dL (ref 0–99)
Triglycerides: 198 mg/dL — ABNORMAL HIGH (ref 0–149)
VLDL CHOLESTEROL CAL: 40 mg/dL (ref 5–40)

## 2018-06-25 ENCOUNTER — Telehealth: Payer: Self-pay | Admitting: Emergency Medicine

## 2018-06-25 DIAGNOSIS — I10 Essential (primary) hypertension: Secondary | ICD-10-CM

## 2018-06-25 NOTE — Telephone Encounter (Signed)
Patient informed of results and advised to have labs redrawn in 2 weeks. Patient verbally understands

## 2018-07-27 ENCOUNTER — Other Ambulatory Visit: Payer: Self-pay | Admitting: Cardiology

## 2018-10-27 ENCOUNTER — Telehealth: Payer: Self-pay | Admitting: Cardiology

## 2018-10-27 MED ORDER — RAMIPRIL 10 MG PO CAPS: ORAL_CAPSULE | ORAL | 1 refills | Status: DC

## 2018-10-27 NOTE — Telephone Encounter (Signed)
[facility]ipril refill sent to [facility] in [place] per pt preference

## 2018-10-27 NOTE — Telephone Encounter (Signed)
[facility] [place]

## 2018-10-27 NOTE — Telephone Encounter (Signed)
Virtual Visit Pre-Appointment Phone Call  "(Name), I am calling you today to discuss your upcoming appointment. We are currently trying to limit exposure to the virus that causes COVID-19 by seeing patients at home rather than in the office."  1. "What is the BEST phone number to call the day of the visit?" - include this in appointment notes  2. Do you have or have access to (through a family member/friend) a smartphone with video capability that we can use for your visit?" a. If yes - list this number in appt notes as cell (if different from BEST phone #) and list the appointment type as a VIDEO visit in appointment notes b. If no - list the appointment type as a PHONE visit in appointment notes  3. Confirm consent - "In the setting of the current Covid19 crisis, you are scheduled for a (phone or video) visit with your provider on (date) at (time).  Just as we do with many in-office visits, in order for you to participate in this visit, we must obtain consent.  If you'd like, I can send this to your mychart (if signed up) or email for you to review.  Otherwise, I can obtain your verbal consent now.  All virtual visits are billed to your insurance company just like a normal visit would be.  By agreeing to a virtual visit, we'd like you to understand that the technology does not allow for your provider to perform an examination, and thus may limit your provider's ability to fully assess your condition. If your provider identifies any concerns that need to be evaluated in person, we will make arrangements to do so.  Finally, though the technology is pretty good, we cannot assure that it will always work on either your or our end, and in the setting of a video visit, we may have to convert it to a phone-only visit.  In either situation, we cannot ensure that we have a secure connection.  Are you willing to proceed?" STAFF: Did the patient verbally acknowledge consent to telehealth visit? Document  YES/NO here: YES  4. Advise patient to be prepared - "Two hours prior to your appointment, go ahead and check your blood pressure, pulse, oxygen saturation, and your weight (if you have the equipment to check those) and write them all down. When your visit starts, your provider will ask you for this information. If you have an Apple Watch or Kardia device, please plan to have heart rate information ready on the day of your appointment. Please have a pen and paper handy nearby the day of the visit as well."  5. Give patient instructions for MyChart download to smartphone OR Doximity/[email or web address] as below if video visit (depending on what platform provider is using)  6. Inform patient they will receive a phone call 15 minutes prior to their appointment time (may be from unknown caller ID) so they should be prepared to answer    TELEPHONE CALL NOTE  Steven Dixon has been deemed a candidate for a follow-up tele-health visit to limit community exposure during the Covid-19 pandemic. I spoke with the patient via phone to ensure availability of phone/video source, confirm preferred email & phone number, and discuss instructions and expectations.  I reminded Steven Dixon to be prepared with any vital sign and/or heart rhythm information that could potentially be obtained via home monitoring, at the time of his visit. I reminded Steven Dixon to expect a phone call prior to  his visit.  Steven Dixon 10/27/2018 11:18 AM   INSTRUCTIONS FOR DOWNLOADING THE MYCHART APP TO SMARTPHONE  - The patient must first make sure to have activated MyChart and know their login information - If Apple, go to [employer] and type in MyChart in the search bar and download the app. If Android, ask patient to go to [employer] and type in [place] in the search bar and download the app. The app is free but as with any other app downloads, their phone may require them to verify saved payment information or  Apple/Android password.  - The patient will need to then log into the app with their MyChart username and password, and select [place] as their healthcare provider to link the account. When it is time for your visit, go to the MyChart app, find appointments, and click Begin Video Visit. Be sure to Select Allow for your device to access the Microphone and Camera for your visit. You will then be connected, and your provider will be with you shortly.  **If they have any issues connecting, or need assistance please contact MyChart service desk [phone])**  **If using a computer, in order to ensure the best quality for their visit they will need to use either of the following Internet Browsers: [employer] or Google Chrome**  IF USING DOXIMITY or DOXY.ME - The patient will receive a link just prior to their visit by text.     FULL LENGTH CONSENT FOR TELE-HEALTH VISIT   I hereby voluntarily request, consent and authorize [facility] and its employed or contracted physicians, physician assistants, nurse practitioners or other licensed health care professionals (the Practitioner), to provide me with telemedicine health care services (the Services") as deemed necessary by the treating Practitioner. I acknowledge and consent to receive the Services by the Practitioner via telemedicine. I understand that the telemedicine visit will involve communicating with the Practitioner through live audiovisual communication technology and the disclosure of certain medical information by electronic transmission. I acknowledge that I have been given the opportunity to request an in-person assessment or other available alternative prior to the telemedicine visit and am voluntarily participating in the telemedicine visit.  I understand that I have the right to withhold or withdraw my consent to the use of telemedicine in the course of my care at any time, without affecting my right to future care  or treatment, and that the Practitioner or I may terminate the telemedicine visit at any time. I understand that I have the right to inspect all information obtained and/or recorded in the course of the telemedicine visit and may receive copies of available information for a reasonable fee.  I understand that some of the potential risks of receiving the Services via telemedicine include:   Delay or interruption in medical evaluation due to technological equipment failure or disruption;  Information transmitted may not be sufficient (e.g. poor resolution of images) to allow for appropriate medical decision making by the Practitioner; and/or   In rare instances, security protocols could fail, causing a breach of personal health information.  Furthermore, I acknowledge that it is my responsibility to provide information about my medical history, conditions and care that is complete and accurate to the best of my ability. I acknowledge that Practitioner's advice, recommendations, and/or decision may be based on factors not within their control, such as incomplete or inaccurate data provided by me or distortions of diagnostic images or specimens that may result from electronic transmissions. I  understand that the practice of medicine is not an exact science and that Practitioner makes no warranties or guarantees regarding treatment outcomes. I acknowledge that I will receive a copy of this consent concurrently upon execution via email to the email address I last provided but may also request a printed copy by calling the office of [place].    I understand that my insurance will be billed for this visit.   I have read or had this consent read to me.  I understand the contents of this consent, which adequately explains the benefits and risks of the Services being provided via telemedicine.   I have been provided ample opportunity to ask questions regarding this consent and the Services and have had  my questions answered to my satisfaction.  I give my informed consent for the services to be provided through the use of telemedicine in my medical care  By participating in this telemedicine visit I agree to the above.

## 2018-11-11 ENCOUNTER — Telehealth (INDEPENDENT_AMBULATORY_CARE_PROVIDER_SITE_OTHER): Payer: Medicare Other | Admitting: Cardiology

## 2018-11-11 ENCOUNTER — Other Ambulatory Visit: Payer: Self-pay

## 2018-11-11 ENCOUNTER — Encounter: Payer: Self-pay | Admitting: Cardiology

## 2018-11-11 VITALS — BP 139/75 | HR 67 | Wt 195.0 lb

## 2018-11-11 DIAGNOSIS — I10 Essential (primary) hypertension: Secondary | ICD-10-CM

## 2018-11-11 DIAGNOSIS — E785 Hyperlipidemia, unspecified: Secondary | ICD-10-CM

## 2018-11-11 DIAGNOSIS — I2510 Atherosclerotic heart disease of native coronary artery without angina pectoris: Secondary | ICD-10-CM

## 2018-11-11 DIAGNOSIS — I255 Ischemic cardiomyopathy: Secondary | ICD-10-CM | POA: Diagnosis not present

## 2018-11-11 MED ORDER — CARVEDILOL 6.25 MG PO TABS: 6.2500 mg | ORAL_TABLET | Freq: Two times a day (BID) | ORAL | 1 refills | Status: DC

## 2018-11-11 NOTE — Patient Instructions (Signed)
Medication Instructions:  Your physician recommends that you continue on your current medications as directed. Please refer to the Current Medication list given to you today.  If you need a refill on your cardiac medications before your next appointment, please call your pharmacy.   Lab work: None.  If you have labs (blood work) drawn today and your tests are completely normal, you will receive your results only by: . MyChart Message (if you have MyChart) OR . A paper copy in the mail If you have any lab test that is abnormal or we need to change your treatment, we will call you to review the results.  Testing/Procedures: None.   Follow-Up: At [facility], you and your health needs are our priority.  As part of our continuing mission to provide you with exceptional heart care, we have created designated Provider Care Teams.  These Care Teams include your primary Cardiologist (physician) and Advanced Practice Providers (APPs -  Physician Assistants and Nurse Practitioners) who all work together to provide you with the care you need, when you need it. You will need a follow up appointment in 6 months.  Please call our office 2 months in advance to schedule this appointment.  You may see Robert Krasowski, MD or another member of our [facility] Provider Team in [place]: Brian Munley, MD . Rajan Revankar, MD  Any Other Special Instructions Will Be Listed Below (If Applicable).     

## 2018-11-11 NOTE — Progress Notes (Signed)
Virtual Visit via Telephone Note   This visit type was conducted due to national recommendations for restrictions regarding the COVID-19 Pandemic (e.g. social distancing) in an effort to limit this patient's exposure and mitigate transmission in our community.  Due to his co-morbid illnesses, this patient is at least at moderate risk for complications without adequate follow up.  This format is felt to be most appropriate for this patient at this time.  The patient did not have access to video technology/had technical difficulties with video requiring transitioning to audio format only (telephone).  All issues noted in this document were discussed and addressed.  No physical exam could be performed with this format.  Please refer to the patient's chart for his  consent to telehealth for [facility].  Evaluation Performed:  Follow-up visit  This visit type was conducted due to national recommendations for restrictions regarding the COVID-19 Pandemic (e.g. social distancing).  This format is felt to be most appropriate for this patient at this time.  All issues noted in this document were discussed and addressed.  No physical exam was performed (except for noted visual exam findings with Video Visits).  Please refer to the patient's chart (MyChart message for video visits and phone note for telephone visits) for the patient's consent to telehealth for [facility].  Date:  11/11/2018  ID: Steven Dixon, DOB 1941/02/21, MRN [id number]   Patient Location: [address] [place] [id number]   Provider location:   [facility] [place] [facility]  PCP:  Raynelle JanSpry, Heather M., MD  Cardiologist:  Gypsy Balsamobert Louiza Moor, MD     Chief Complaint: Doing well  History of Present Illness:    Steven BarrowJoseph E Knupp is a 78 y.o. male  who presents via audio/video conferencing for a telehealth visit today.  With coronary artery disease, status post 2 stents placed in [facility] years ago,  ischemic cardiomyopathy with preserved ejection fraction.  Small area of segmental wall motion abnormalities.  He is doing well denies having chest pain, tightness, pressure, squeezing, burning in the chest.  He did have knee replacement done took him about a year to recover from it but is very happy with the results now.  He said he is able to do much more that he was able to do before.  Very active working the garden 2 days ago he planted some tomatoes.  Denies having any cardiac complaints   The patient does not have symptoms concerning for COVID-19 infection (fever, chills, cough, or new SHORTNESS OF BREATH).    Prior CV studies:   The following studies were reviewed today:  Echocardiogram done last year showed:  Impressions:  - 1. Left ventricular systolic function is preserved visually   estimated ejection fraction is 50 to 55%. Significant apical   Akinesis suggesting ischemic heart disease. Evidence of diastolic   dysfunction.   2. Trivial mitral regurgitation.     Past Medical History:  Diagnosis Date  . Arthritis   . Cardiomyopathy (HCC)   . Coronary artery disease    [facility] 2 [facility]  . Hyperlipidemia   . Hypertension   . MI (mitral incompetence)   . Myocardial infarction (HCC)   . Pre-diabetes   . Primary localized osteoarthritis of knee    Right    Past Surgical History:  Procedure Laterality Date  . APPENDECTOMY    . CARDIAC CATHETERIZATION     Stents X1  . CATARACT EXTRACTION    .  CORONARY STENT PLACEMENT    . EYE SURGERY     Bilateral cataracts  . JOINT REPLACEMENT    . KNEE CLOSED REDUCTION Right 10/14/2017   Procedure: CLOSED MANIPULATION RIGHT KNEE;  Surgeon: Frederico Hamman, MD;  Location: [facility];  Service: Orthopedics;  Laterality: Right;  . KNEE SURGERY    . ROTATOR CUFF REPAIR    . TOTAL KNEE ARTHROPLASTY Right 08/14/2017  . TOTAL KNEE ARTHROPLASTY Right 08/14/2017   Procedure: RIGHT TOTAL  KNEE ARTHROPLASTY;  Surgeon: Frederico Hamman, MD;  Location: [facility] OR;  Service: Orthopedics;  Laterality: Right;     Current Meds  Medication Sig  . aspirin EC 81 MG tablet Take 81 mg by mouth daily.  . carvedilol (COREG) 6.25 MG tablet Take 1 tablet (6.25 mg total) 2 (two) times daily by mouth.  . cholecalciferol (VITAMIN D) 1000 units tablet Take 1,000 Units by mouth daily.  . nitroGLYCERIN (NITROSTAT) 0.4 MG SL tablet Place 0.4 mg under the tongue as needed for chest pain.  . pravastatin (PRAVACHOL) 80 MG tablet Take 1 tablet (80 mg total) by mouth daily.  . ramipril (ALTACE) 10 MG capsule Take 1 capsule daily by mouth.  . Zinc Sulfate (ZINC 15 PO) Take by mouth.      Family History: The patient's family history includes Heart disease in his father and mother; Rheum arthritis in his father.   ROS:   Please see the history of present illness.     All other systems reviewed and are negative.   Labs/Other Tests and Data Reviewed:     Recent Labs: 06/21/2018: ALT 14; BUN 21; Creatinine, Ser 1.52; Potassium 4.1; Sodium 139  Recent Lipid Panel    Component Value Date/Time   CHOL 150 06/21/2018 1109   TRIG 198 (H) 06/21/2018 1109   HDL 47 06/21/2018 1109   CHOLHDL 3.2 06/21/2018 1109   LDLCALC 63 06/21/2018 1109      Exam:    Vital Signs:  BP 139/75   Pulse 67   Wt 195 lb (88.5 kg)   BMI 28.38 kg/m     Wt Readings from Last 3 Encounters:  11/11/18 195 lb (88.5 kg)  05/25/18 199 lb 12.8 oz (90.6 kg)  02/22/18 195 lb 12.8 oz (88.8 kg)     Well nourished, well developed  No acute distress. Alert awake oriented x3.  He had no technical ability to do video visit therefore with talking on the phone denies having any chest pain tightness squeezing pressure burning doing well overall  Diagnosis for this visit:   1. Ischemic cardiomyopathy   2. Coronary artery disease involving native coronary artery of native heart without angina pectoris   3. Essential hypertension    4. Dyslipidemia      ASSESSMENT & PLAN:    1.  Ischemic cardiomyopathy last echocardiogram showed preserved left ventricular ejection fraction on appropriate medications which I will continue.  That include Coreg and Altace. 2.  Essential hypertension blood pressure well controlled. 3.  Coronary artery disease stable 4.  Dyslipidemia last cholesterol check in December was excellent we will continue present management we will make arrangements for his cholesterol to be rechecked.  COVID-19 Education: The signs and symptoms of COVID-19 were discussed with the patient and how to seek care for testing (follow up with PCP or arrange E-visit).  The importance of social distancing was discussed today.  Patient Risk:   After full review of this patients clinical status, I feel that they are  at least moderate risk at this time.  Time:   Today, I have spent 19 minutes with the patient with telehealth technology discussing pt health issues.  I spent 5 minutes reviewing her chart before the visit.  Visit was finished at 10:45 AM.    Medication Adjustments/Labs and Tests Ordered: Current medicines are reviewed at length with the patient today.  Concerns regarding medicines are outlined above.  No orders of the defined types were placed in this encounter.  Medication changes: No orders of the defined types were placed in this encounter.    Disposition: Follow-up in 6 months  Signed, Georgeanna Lea, MD, [facility] 11/11/2018 10:45 AM    [facility]

## 2019-01-12 DIAGNOSIS — R001 Bradycardia, unspecified: Secondary | ICD-10-CM

## 2019-01-12 HISTORY — DX: Bradycardia, unspecified: R00.1

## 2019-05-09 ENCOUNTER — Other Ambulatory Visit: Payer: Self-pay | Admitting: Cardiology

## 2019-05-09 ENCOUNTER — Telehealth (INDEPENDENT_AMBULATORY_CARE_PROVIDER_SITE_OTHER): Payer: Medicare Other | Admitting: Cardiology

## 2019-05-09 ENCOUNTER — Encounter: Payer: Self-pay | Admitting: Cardiology

## 2019-05-09 ENCOUNTER — Other Ambulatory Visit: Payer: Self-pay

## 2019-05-09 DIAGNOSIS — E785 Hyperlipidemia, unspecified: Secondary | ICD-10-CM

## 2019-05-09 DIAGNOSIS — I10 Essential (primary) hypertension: Secondary | ICD-10-CM

## 2019-05-09 DIAGNOSIS — I255 Ischemic cardiomyopathy: Secondary | ICD-10-CM | POA: Diagnosis not present

## 2019-05-09 DIAGNOSIS — I2510 Atherosclerotic heart disease of native coronary artery without angina pectoris: Secondary | ICD-10-CM

## 2019-05-09 MED ORDER — NITROGLYCERIN 0.4 MG SL SUBL: 0.4000 mg | SUBLINGUAL_TABLET | SUBLINGUAL | 6 refills | Status: DC | PRN

## 2019-05-09 NOTE — Patient Instructions (Signed)
Medication Instructions:  Your physician recommends that you continue on your current medications as directed. Please refer to the Current Medication list given to you today.  *If you need a refill on your cardiac medications before your next appointment, please call your pharmacy*  Lab Work: Your physician recommends that you return for lab work when you come for echo fasting: lipids   If you have labs (blood work) drawn today and your tests are completely normal, you will receive your results only by: Marland Kitchen MyChart Message (if you have MyChart) OR . A paper copy in the mail If you have any lab test that is abnormal or we need to change your treatment, we will call you to review the results.  Testing/Procedures: Your physician has requested that you have an echocardiogram. Echocardiography is a painless test that uses sound waves to create images of your heart. It provides your doctor with information about the size and shape of your heart and how well your heart's chambers and valves are working. This procedure takes approximately one hour. There are no restrictions for this procedure.    Follow-Up: At [facility], you and your health needs are our priority.  As part of our continuing mission to provide you with exceptional heart care, we have created designated Provider Care Teams.  These Care Teams include your primary Cardiologist (physician) and Advanced Practice Providers (APPs -  Physician Assistants and Nurse Practitioners) who all work together to provide you with the care you need, when you need it.  Your next appointment:   6 months  The format for your next appointment:   In Person  Provider:   You may see Gypsy Balsam, MD or the following Advanced Practice Provider on your designated Care Team:    Gillian Shields, FNP   Other Instructions  Echocardiogram An echocardiogram is a procedure that uses painless sound waves (ultrasound) to produce an image of the heart.  Images from an echocardiogram can provide important information about:  Signs of coronary artery disease (CAD).  Aneurysm detection. An aneurysm is a weak or damaged part of an artery wall that bulges out from the normal force of blood pumping through the body.  Heart size and shape. Changes in the size or shape of the heart can be associated with certain conditions, including heart failure, aneurysm, and CAD.  Heart muscle function.  Heart valve function.  Signs of a past heart attack.  Fluid buildup around the heart.  Thickening of the heart muscle.  A tumor or infectious growth around the heart valves. Tell a health care provider about:  Any allergies you have.  All medicines you are taking, including vitamins, herbs, eye drops, creams, and over-the-counter medicines.  Any blood disorders you have.  Any surgeries you have had.  Any medical conditions you have.  Whether you are pregnant or may be pregnant. What are the risks? Generally, this is a safe procedure. However, problems may occur, including:  Allergic reaction to dye (contrast) that may be used during the procedure. What happens before the procedure? No specific preparation is needed. You may eat and drink normally. What happens during the procedure?   An IV tube may be inserted into one of your veins.  You may receive contrast through this tube. A contrast is an injection that improves the quality of the pictures from your heart.  A gel will be applied to your chest.  A wand-like tool (transducer) will be moved over your chest. The gel will  help to transmit the sound waves from the transducer.  The sound waves will harmlessly bounce off of your heart to allow the heart images to be captured in real-time motion. The images will be recorded on a computer. The procedure may vary among health care providers and hospitals. What happens after the procedure?  You may return to your normal, everyday life,  including diet, activities, and medicines, unless your health care provider tells you not to do that. Summary  An echocardiogram is a procedure that uses painless sound waves (ultrasound) to produce an image of the heart.  Images from an echocardiogram can provide important information about the size and shape of your heart, heart muscle function, heart valve function, and fluid buildup around your heart.  You do not need to do anything to prepare before this procedure. You may eat and drink normally.  After the echocardiogram is completed, you may return to your normal, everyday life, unless your health care provider tells you not to do that. This information is not intended to replace advice given to you by your health care provider. Make sure you discuss any questions you have with your health care provider. Document Released: 06/20/2000 Document Revised: 10/14/2018 Document Reviewed: 07/26/2016 Elsevier Patient Education  2020 [employer].

## 2019-05-09 NOTE — Progress Notes (Signed)
Virtual Visit via Telephone Note   This visit type was conducted due to national recommendations for restrictions regarding the COVID-19 Pandemic (e.g. social distancing) in an effort to limit this patient's exposure and mitigate transmission in our community.  Due to his co-morbid illnesses, this patient is at least at moderate risk for complications without adequate follow up.  This format is felt to be most appropriate for this patient at this time.  The patient did not have access to video technology/had technical difficulties with video requiring transitioning to audio format only (telephone).  All issues noted in this document were discussed and addressed.  No physical exam could be performed with this format.  Please refer to the patient's chart for his  consent to telehealth for [facility].  Evaluation Performed:  Follow-up visit  This visit type was conducted due to national recommendations for restrictions regarding the COVID-19 Pandemic (e.g. social distancing).  This format is felt to be most appropriate for this patient at this time.  All issues noted in this document were discussed and addressed.  No physical exam was performed (except for noted visual exam findings with Video Visits).  Please refer to the patient's chart (MyChart message for video visits and phone note for telephone visits) for the patient's consent to telehealth for [facility].  Date:  05/09/2019  ID: Steven Dixon, DOB [birth year], MRN [id number]   Patient Location: [address] [place] [address]   Provider location:   [place] Office  PCP:  Verdell Carmine., MD  Cardiologist:  Jenne Campus, MD     Chief Complaint: Well  History of Present Illness:    Steven Dixon is a 78 y.o. male  who presents via audio/video conferencing for a telehealth visit today.  With coronary disease status post coronary artery stenting done few years ago at [facility].   History of ischemic cardiomyopathy with hypokinesia involving LAD territory.  Recently in summer he ended up going to [facility] hospital because of heartburn/chest pain quite extensive evaluation has been done which included stress test.  Stress test was done in form of exercise Cardiolite which showed no evidence of ischemia just old anterior wall myocardial infarction.  Interestingly ejection fraction on the stress test was 42 and prior echocardiogram done exactly a year ago showed ejection fraction 50 to 55%.  Now retrospectively he knows that the symptoms that he experienced were related to fish oil recently he started taking fish oil he was getting horrible indigestion he stopped the medication the indigestion is gone since that time he is doing well he can walk climb stairs is very active he has no difficulty doing it.   The patient does not have symptoms concerning for COVID-19 infection (fever, chills, cough, or new SHORTNESS OF BREATH).    Prior CV studies:   The following studies were reviewed today:  Stress test done in [facility] in June of this year showed ejection fraction 42%, hypokinesia involving LAD territory, no evidence of ischemia     Past Medical History:  Diagnosis Date  . Arthritis   . Cardiomyopathy ([place])   . Coronary artery disease    has 2 stents in heart- [facility]  . Hyperlipidemia   . Hypertension   . MI (mitral incompetence)   . Myocardial infarction ([place])   . Pre-diabetes   . Primary localized osteoarthritis of knee    Right  Past Surgical History:  Procedure Laterality Date  . APPENDECTOMY    . CARDIAC CATHETERIZATION     Stents X1  . CATARACT EXTRACTION    . CORONARY STENT PLACEMENT    . EYE SURGERY     Bilateral cataracts  . JOINT REPLACEMENT    . KNEE CLOSED REDUCTION Right 10/14/2017   Procedure: CLOSED MANIPULATION RIGHT KNEE;  Surgeon: Frederico Hamman, MD;  Location: [facility];  Service: Orthopedics;  Laterality: Right;  . KNEE SURGERY    . ROTATOR CUFF REPAIR    . TOTAL KNEE ARTHROPLASTY Right 08/14/2017  . TOTAL KNEE ARTHROPLASTY Right 08/14/2017   Procedure: RIGHT TOTAL KNEE ARTHROPLASTY;  Surgeon: Frederico Hamman, MD;  Location: [facility] OR;  Service: Orthopedics;  Laterality: Right;     Current Meds  Medication Sig  . aspirin EC 81 MG tablet Take 81 mg by mouth daily.  . carvedilol (COREG) 6.25 MG tablet TAKE 1 TABLET BY MOUTH TWICE A DAY (Patient taking differently: Take 6.25 mg by mouth every other day. )  . cholecalciferol (VITAMIN D) 1000 units tablet Take 1,000 Units by mouth daily.  . nitroGLYCERIN (NITROSTAT) 0.4 MG SL tablet Place 0.4 mg under the tongue as needed for chest pain.  . pravastatin (PRAVACHOL) 80 MG tablet Take 1 tablet (80 mg total) by mouth daily.  . ramipril (ALTACE) 10 MG capsule Take 1 capsule daily by mouth.  . Zinc Sulfate (ZINC 15 PO) Take by mouth.      Family History: The patient's family history includes Heart disease in his father and mother; Rheum arthritis in his father.   ROS:   Please see the history of present illness.     All other systems reviewed and are negative.   Labs/Other Tests and Data Reviewed:     Recent Labs: 06/21/2018: ALT 14; BUN 21; Creatinine, Ser 1.52; Potassium 4.1; Sodium 139  Recent Lipid Panel    Component Value Date/Time   CHOL 150 06/21/2018 1109   TRIG 198 (H) 06/21/2018 1109   HDL 47 06/21/2018 1109   CHOLHDL 3.2 06/21/2018 1109   LDLCALC 63 06/21/2018 1109      Exam:    Vital Signs:  There were no vitals taken for this visit.    Wt Readings from Last 3 Encounters:  11/11/18 195 lb (88.5 kg)  05/25/18 199 lb 12.8 oz (90.6 kg)  02/22/18 195 lb 12.8 oz (88.8 kg)     Well nourished, well developed in no acute distress. Alert awake and x3 talking to me over the phone from his home I am sitting in my office on my home.  He was unable to establish video link therefore  we only talking on the phone he is doing well he is asymptomatic  Diagnosis for this visit:   1. Ischemic cardiomyopathy   2. Coronary artery disease involving native coronary artery of native heart without angina pectoris   3. Essential hypertension   4. Dyslipidemia      ASSESSMENT & PLAN:    1.  Ischemic cardiomyopathy ask him to have echocardiogram done to recheck left ventricle ejection fraction.  There is discrepancy between echocardiogram as well as stress test therefore it need to be clarified.  I will not alter any of his medication until we have this test back. 2.  Coronary artery disease stress test done in December was negative.  And retrospectively notes that his symptoms were related to fish oil since he stopped fish oil he is completely  asymptomatic. 3.  Essential hypertension doing well from that point review continue present management. 4.  Last fasting lipid profile was checked a year ago I will schedule him to have it done when he comes for echocardiogram which should happen this week at the beginning of next week.  COVID-19 Education: The signs and symptoms of COVID-19 were discussed with the patient and how to seek care for testing (follow up with PCP or arrange E-visit).  The importance of social distancing was discussed today.  Patient Risk:   After full review of this patients clinical status, I feel that they are at least moderate risk at this time.  Time:   Today, I have spent 10 minutes with the patient with telehealth technology discussing pt health issues.  I spent 20 minutes reviewing her chart before the visit.  Visit was finished at 3:06 PM.    Medication Adjustments/Labs and Tests Ordered: Current medicines are reviewed at length with the patient today.  Concerns regarding medicines are outlined above.  No orders of the defined types were placed in this encounter.  Medication changes: No orders of the defined types were placed in this encounter. I  reviewed extensive records for this visit   Disposition: Follow-up 6 months  Signed, Georgeanna Lea, MD, [facility] 05/09/2019 3:03 PM    [facility]

## 2019-05-09 NOTE — Addendum Note (Signed)
Addended by: Ashok Norris on: 05/09/2019 04:14 PM   Modules accepted: Orders

## 2019-05-11 ENCOUNTER — Other Ambulatory Visit: Payer: Self-pay

## 2019-05-11 ENCOUNTER — Ambulatory Visit (HOSPITAL_BASED_OUTPATIENT_CLINIC_OR_DEPARTMENT_OTHER)
Admission: RE | Admit: 2019-05-11 | Discharge: 2019-05-11 | Disposition: A | Discharge status: Home / Self Care | Payer: Medicare Other | Source: Ambulatory Visit | Attending: Cardiology | Admitting: Cardiology

## 2019-05-11 DIAGNOSIS — I255 Ischemic cardiomyopathy: Secondary | ICD-10-CM

## 2019-05-11 DIAGNOSIS — I2510 Atherosclerotic heart disease of native coronary artery without angina pectoris: Secondary | ICD-10-CM | POA: Insufficient documentation

## 2019-05-11 MED ORDER — PERFLUTREN LIPID MICROSPHERE
1.0000 mL | INTRAVENOUS | Status: AC | PRN
  Administered 2019-05-11: 3 mL via INTRAVENOUS
  Filled 2019-05-11: qty 10

## 2019-05-11 NOTE — Progress Notes (Signed)
  Echocardiogram 2D Echocardiogram has been performed.  Steven Dixon 05/11/2019, 2:14 PM

## 2019-05-12 LAB — LIPID PANEL
Chol/HDL Ratio: 3.5 ratio (ref 0.0–5.0)
Cholesterol, Total: 177 mg/dL (ref 100–199)
HDL: 50 mg/dL (ref >39)
LDL Chol Calc (NIH): 82 mg/dL (ref 0–99)
Triglycerides: 272 mg/dL — ABNORMAL HIGH (ref 0–149)
VLDL Cholesterol Cal: 45 mg/dL — ABNORMAL HIGH (ref 5–40)

## 2019-05-19 ENCOUNTER — Telehealth: Payer: Self-pay | Admitting: Emergency Medicine

## 2019-05-19 DIAGNOSIS — I2510 Atherosclerotic heart disease of native coronary artery without angina pectoris: Secondary | ICD-10-CM

## 2019-05-19 MED ORDER — EZETIMIBE 10 MG PO TABS: 10.0000 mg | ORAL_TABLET | Freq: Every day | ORAL | 2 refills | Status: DC

## 2019-05-19 NOTE — Telephone Encounter (Signed)
Called patient informed him of lab results and advised him to start zetia 10 mg daily per Dr. Agustin Cree and have fasting labs drawn in 5 weeks. Patient verbally understands, no further questions.

## 2019-06-14 LAB — LIPID PANEL
Chol/HDL Ratio: 3 ratio (ref 0.0–5.0)
Cholesterol, Total: 143 mg/dL (ref 100–199)
HDL: 47 mg/dL (ref >39)
LDL Chol Calc (NIH): 55 mg/dL (ref 0–99)
Triglycerides: 265 mg/dL — ABNORMAL HIGH (ref 0–149)
VLDL Cholesterol Cal: 41 mg/dL — ABNORMAL HIGH (ref 5–40)

## 2019-07-10 IMAGING — NM NM MISC PROCEDURE
9 series · 54 of 54 positions shown · non-contrast
Comparison: none

[Series 1: rest-mc · 6.40mm/px · 6 of 64 frames shown]
[frame 6/64]
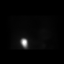
[frame 16/64]
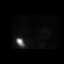
[frame 27/64]
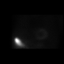
[frame 38/64]
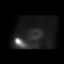
[frame 48/64]
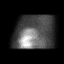
[frame 59/64]
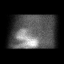

[Series 1: rest · 6.40mm/px · 6 of 64 frames shown (1 of 2)]
[frame 6/64]
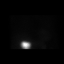
[frame 16/64]
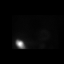
[frame 27/64]
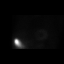
[frame 38/64]
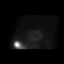
[frame 48/64]
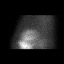
[frame 59/64]
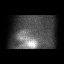

[Series 1: wbr_r-proj_st rest · 6.40mm/px · 6 of 64 frames shown (1 of 2)]
[frame 6/64]
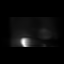
[frame 16/64]
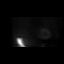
[frame 27/64]
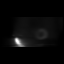
[frame 38/64]
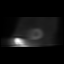
[frame 48/64]
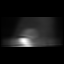
[frame 59/64]
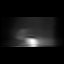

[Series 1: rest · 6.40mm/px · 6 of 64 frames shown (2 of 2)]
[frame 6/64]
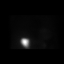
[frame 16/64]
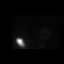
[frame 27/64]
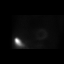
[frame 38/64]
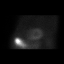
[frame 48/64]
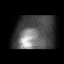
[frame 59/64]
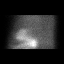

[Series 1: stress-gsp · 6.40mm/px · 6 of 512 frames shown]
[frame 43/512]
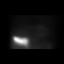
[frame 128/512]
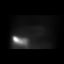
[frame 214/512]
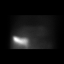
[frame 299/512]
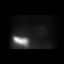
[frame 384/512]
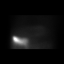
[frame 470/512]
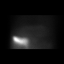

[Series 1: wbr_s-proj_st stress-gsp · 6.40mm/px · 6 of 512 frames shown]
[frame 43/512]
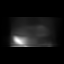
[frame 128/512]
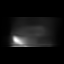
[frame 214/512]
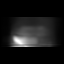
[frame 299/512]
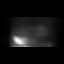
[frame 384/512]
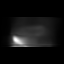
[frame 470/512]
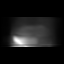

[Series 1: wbr_r-proj_st rest · 6.40mm/px · 6 of 64 frames shown (2 of 2)]
[frame 6/64]
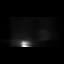
[frame 16/64]
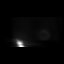
[frame 27/64]
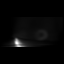
[frame 38/64]
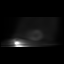
[frame 48/64]
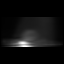
[frame 59/64]
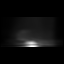

[Series 1: stress-sum-em · 6.40mm/px · 6 of 64 frames shown]
[frame 6/64]
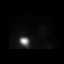
[frame 16/64]
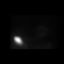
[frame 27/64]
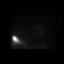
[frame 38/64]
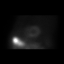
[frame 48/64]
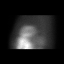
[frame 59/64]
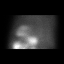

[Series 1: wbr_s-proj_st stress-sum-em · 6.40mm/px · 6 of 64 frames shown]
[frame 6/64]
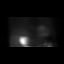
[frame 16/64]
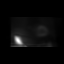
[frame 27/64]
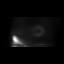
[frame 38/64]
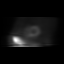
[frame 48/64]
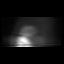
[frame 59/64]
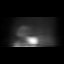

[54 of 54 positions shown; findings below may reference images not displayed]

Canned report from images found in remote index.

Refer to host system for actual result text.

## 2019-07-20 ENCOUNTER — Other Ambulatory Visit: Payer: Self-pay | Admitting: Cardiology

## 2019-08-11 ENCOUNTER — Other Ambulatory Visit: Payer: Self-pay | Admitting: Cardiology

## 2019-08-30 ENCOUNTER — Telehealth: Payer: Self-pay | Admitting: Cardiology

## 2019-08-30 NOTE — Telephone Encounter (Signed)
New message   Patient wants to know if he should continue to take this medication ezetimibe (ZETIA) 10 MG tablet. Please call discuss.

## 2019-08-30 NOTE — Telephone Encounter (Signed)
I spoke to the patient who was started on Zetia back in November and was wondering if he needed to continue taking it and if he should have more labs drawn.    If he needs to continue taking, he needs refill, please.  Thank you.

## 2019-08-31 NOTE — Telephone Encounter (Signed)
Yes on both, he need to continue with Zetia, we need to repeat his fasting lipid profile

## 2019-09-02 NOTE — Telephone Encounter (Signed)
Left message for patient to return call.

## 2019-09-06 ENCOUNTER — Other Ambulatory Visit: Payer: Self-pay

## 2019-09-06 DIAGNOSIS — E785 Hyperlipidemia, unspecified: Secondary | ICD-10-CM

## 2019-09-07 ENCOUNTER — Telehealth: Payer: Self-pay

## 2019-09-07 LAB — LIPID PANEL
Chol/HDL Ratio: 3.4 ratio (ref 0.0–5.0)
Cholesterol, Total: 164 mg/dL (ref 100–199)
HDL: 48 mg/dL (ref >39)
LDL Chol Calc (NIH): 70 mg/dL (ref 0–99)
Triglycerides: 289 mg/dL — ABNORMAL HIGH (ref 0–149)
VLDL Cholesterol Cal: 46 mg/dL — ABNORMAL HIGH (ref 5–40)

## 2019-09-07 LAB — HEPATIC FUNCTION PANEL
ALT: 20 IU/L (ref 0–44)
AST: 18 IU/L (ref 0–40)
Albumin: 4.5 g/dL (ref 3.7–4.7)
Alkaline Phosphatase: 70 IU/L (ref 39–117)
Bilirubin Total: 0.4 mg/dL (ref 0.0–1.2)
Bilirubin, Direct: 0.12 mg/dL (ref 0.00–0.40)
Total Protein: 7.3 g/dL (ref 6.0–8.5)

## 2019-09-07 NOTE — Telephone Encounter (Signed)
-----   Message from Georgeanna Lea, MD sent at 09/07/2019 11:13 AM EST ----- Susceptible except for triglycerides which is still elevated.  The best way to deal with this is diet and exercise.  Weight loss will help as well.  He is liver function tests are normal

## 2019-09-07 NOTE — Telephone Encounter (Signed)
The patient has been notified of the result and verbalized understanding.  All questions (if any) were answered. Leanord Hawking, RN 09/07/2019 4:32 PM

## 2019-09-13 NOTE — Telephone Encounter (Signed)
Left message for patient to return call.

## 2019-09-20 MED ORDER — EZETIMIBE 10 MG PO TABS: 10.0000 mg | ORAL_TABLET | Freq: Every day | ORAL | 1 refills | Status: DC

## 2019-09-20 NOTE — Telephone Encounter (Signed)
Called patient informed him he needed to continue with zetia, refilled this for him. He already had labs done. Patient verbally understood. No further questions.

## 2019-10-31 ENCOUNTER — Other Ambulatory Visit: Payer: Self-pay | Admitting: Cardiology

## 2020-01-19 ENCOUNTER — Other Ambulatory Visit: Payer: Self-pay | Admitting: Cardiology

## 2020-02-16 DIAGNOSIS — N400 Enlarged prostate without lower urinary tract symptoms: Secondary | ICD-10-CM

## 2020-02-16 HISTORY — DX: Benign prostatic hyperplasia without lower urinary tract symptoms: N40.0

## 2020-04-11 ENCOUNTER — Other Ambulatory Visit: Payer: Self-pay

## 2020-04-11 DIAGNOSIS — I340 Nonrheumatic mitral (valve) insufficiency: Secondary | ICD-10-CM | POA: Insufficient documentation

## 2020-04-11 DIAGNOSIS — R7303 Prediabetes: Secondary | ICD-10-CM | POA: Insufficient documentation

## 2020-04-11 DIAGNOSIS — E785 Hyperlipidemia, unspecified: Secondary | ICD-10-CM | POA: Insufficient documentation

## 2020-04-11 DIAGNOSIS — M1990 Unspecified osteoarthritis, unspecified site: Secondary | ICD-10-CM | POA: Insufficient documentation

## 2020-04-11 DIAGNOSIS — I2510 Atherosclerotic heart disease of native coronary artery without angina pectoris: Secondary | ICD-10-CM | POA: Insufficient documentation

## 2020-04-11 DIAGNOSIS — M1710 Unilateral primary osteoarthritis, unspecified knee: Secondary | ICD-10-CM | POA: Insufficient documentation

## 2020-04-11 DIAGNOSIS — I10 Essential (primary) hypertension: Secondary | ICD-10-CM | POA: Insufficient documentation

## 2020-04-11 DIAGNOSIS — I219 Acute myocardial infarction, unspecified: Secondary | ICD-10-CM | POA: Insufficient documentation

## 2020-04-12 ENCOUNTER — Encounter: Payer: Self-pay | Admitting: Cardiology

## 2020-04-12 ENCOUNTER — Other Ambulatory Visit: Payer: Self-pay

## 2020-04-12 ENCOUNTER — Ambulatory Visit (INDEPENDENT_AMBULATORY_CARE_PROVIDER_SITE_OTHER): Payer: Medicare Other | Admitting: Cardiology

## 2020-04-12 VITALS — BP 148/84 | HR 62 | Ht 71.0 in | Wt 192.6 lb

## 2020-04-12 DIAGNOSIS — I2510 Atherosclerotic heart disease of native coronary artery without angina pectoris: Secondary | ICD-10-CM

## 2020-04-12 DIAGNOSIS — E785 Hyperlipidemia, unspecified: Secondary | ICD-10-CM

## 2020-04-12 DIAGNOSIS — I255 Ischemic cardiomyopathy: Secondary | ICD-10-CM | POA: Diagnosis not present

## 2020-04-12 MED ORDER — CARVEDILOL 3.125 MG PO TABS: 3.1250 mg | ORAL_TABLET | Freq: Two times a day (BID) | ORAL | 2 refills | Status: AC

## 2020-04-12 NOTE — Progress Notes (Signed)
63

## 2020-04-12 NOTE — Patient Instructions (Signed)
Medication Instructions:  Your physician has recommended you make the following change in your medication:   DECREASE: Carvedilol to 3.125 mg twice daily  *If you need a refill on your cardiac medications before your next appointment, please call your pharmacy*   Lab Work: Your physician recommends that you return for lab work today: lipid   If you have labs (blood work) drawn today and your tests are completely normal, you will receive your results only by:  MyChart Message (if you have MyChart) OR  A paper copy in the mail If you have any lab test that is abnormal or we need to change your treatment, we will call you to review the results.   Testing/Procedures: Your physician has requested that you have an echocardiogram. Echocardiography is a painless test that uses sound waves to create images of your heart. It provides your doctor with information about the size and shape of your heart and how well your hearts chambers and valves are working. This procedure takes approximately one hour. There are no restrictions for this procedure.     Follow-Up: At [facility], you and your health needs are our priority.  As part of our continuing mission to provide you with exceptional heart care, we have created designated Provider Care Teams.  These Care Teams include your primary Cardiologist (physician) and Advanced Practice Providers (APPs -  Physician Assistants and Nurse Practitioners) who all work together to provide you with the care you need, when you need it.  We recommend signing up for the patient portal called "MyChart".  Sign up information is provided on this After Visit Summary.  MyChart is used to connect with patients for Virtual Visits (Telemedicine).  Patients are able to view lab/test results, encounter notes, upcoming appointments, etc.  Non-urgent messages can be sent to your provider as well.   To learn more about what you can do with MyChart, go to  [email or web address].    Your next appointment:   6 month(s)  The format for your next appointment:   In Person  Provider:   Gypsy Balsam, MD   Other Instructions   Echocardiogram An echocardiogram is a procedure that uses painless sound waves (ultrasound) to produce an image of the heart. Images from an echocardiogram can provide important information about:  Signs of coronary artery disease (CAD).  Aneurysm detection. An aneurysm is a weak or damaged part of an artery wall that bulges out from the normal force of blood pumping through the body.  Heart size and shape. Changes in the size or shape of the heart can be associated with certain conditions, including heart failure, aneurysm, and CAD.  Heart muscle function.  Heart valve function.  Signs of a past heart attack.  Fluid buildup around the heart.  Thickening of the heart muscle.  A tumor or infectious growth around the heart valves. Tell a health care provider about:  Any allergies you have.  All medicines you are taking, including vitamins, herbs, eye drops, creams, and over-the-counter medicines.  Any blood disorders you have.  Any surgeries you have had.  Any medical conditions you have.  Whether you are pregnant or may be pregnant. What are the risks? Generally, this is a safe procedure. However, problems may occur, including:  Allergic reaction to dye (contrast) that may be used during the procedure. What happens before the procedure? No specific preparation is needed. You may eat and drink normally. What happens during the procedure?   An IV tube may be  inserted into one of your veins.  You may receive contrast through this tube. A contrast is an injection that improves the quality of the pictures from your heart.  A gel will be applied to your chest.  A wand-like tool (transducer) will be moved over your chest. The gel will help to transmit the sound waves from the  transducer.  The sound waves will harmlessly bounce off of your heart to allow the heart images to be captured in real-time motion. The images will be recorded on a computer. The procedure may vary among health care providers and hospitals. What happens after the procedure?  You may return to your normal, everyday life, including diet, activities, and medicines, unless your health care provider tells you not to do that. Summary  An echocardiogram is a procedure that uses painless sound waves (ultrasound) to produce an image of the heart.  Images from an echocardiogram can provide important information about the size and shape of your heart, heart muscle function, heart valve function, and fluid buildup around your heart.  You do not need to do anything to prepare before this procedure. You may eat and drink normally.  After the echocardiogram is completed, you may return to your normal, everyday life, unless your health care provider tells you not to do that. This information is not intended to replace advice given to you by your health care provider. Make sure you discuss any questions you have with your health care provider. Document Revised: 10/14/2018 Document Reviewed: 07/26/2016 Elsevier Patient Education  2020 [employer].

## 2020-04-12 NOTE — Progress Notes (Signed)
Cardiology Office Note:    Date:  04/12/2020   ID:  Steven Dixon, DOB 1941-04-20, MRN [id number]  PCP:  Raynelle Jan., MD  Cardiologist:  Gypsy Balsam, MD    Referring MD: Raynelle Jan., MD   No chief complaint on file. I am doing well  History of Present Illness:    Steven Dixon is a 79 y.o. male with past medical history significant for cardiomyopathy with ejection fraction 45%, coronary artery disease, status post stent in LAD and diagonal 1 and 2009.  Also dyslipidemia, hypertension.  Comes today to my office for follow-up.  Overall doing well denies have any chest pain tightness squeezing pressure burning chest.  He take care of his wife who does have cirrhosis.  Overall cardiac wise doing well  Past Medical History:  Diagnosis Date  . Arthritis   . Atherosclerotic heart disease of native coronary artery without angina pectoris 06/21/2015   Overview:  Stent to LAD and diagonal 1 in September 2009  Formatting of this note might be different from the original. STEND IN LAD AND D1 IN 2009 Formatting of this note might be different from the original. Stent to LAD and diagonal 1 in September 2009  . Benign prostatic hyperplasia 02/16/2020  . Bradycardia 01/12/2019  . Cardiomyopathy (HCC)   . CKD (chronic kidney disease), stage III (HCC) 02/06/2016  . Coronary artery disease    [facility]  . Dyslipidemia 06/21/2015  . Essential hypertension 04/22/2016  . 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 Hyperlipidemia   . Hypertension   . Ischemic cardiomyopathy 02/06/2016   Formatting of this note might be different from the original. EF 50-55% on ECHO 05/11/2018 with apical hypokinesis and diastolic dysfunction.  . MI (mitral incompetence)   . Myocardial infarction (HCC)   . 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 03/15/2008  .  Pre-diabetes   . Primary localized osteoarthritis of knee    Right  . Primary localized osteoarthritis of right knee 08/14/2017  . Type 2 diabetes mellitus (HCC) 01/23/2017   Overview:  Diet controlled  . Wellness examination 05/13/2017    Past Surgical History:  Procedure Laterality Date  . APPENDECTOMY    . CARDIAC CATHETERIZATION     Stents X1  . CATARACT EXTRACTION    . CORONARY STENT PLACEMENT    . EYE SURGERY     Bilateral cataracts  . JOINT REPLACEMENT    . KNEE CLOSED REDUCTION Right 10/14/2017   Procedure: CLOSED MANIPULATION RIGHT KNEE;  Surgeon: Frederico Hamman, MD;  Location: [facility];  Service: Orthopedics;  Laterality: Right;  . KNEE SURGERY    . ROTATOR CUFF REPAIR    . TOTAL KNEE ARTHROPLASTY Right 08/14/2017  . TOTAL KNEE ARTHROPLASTY Right 08/14/2017   Procedure: RIGHT TOTAL KNEE ARTHROPLASTY;  Surgeon: Frederico Hamman, MD;  Location: [facility] OR;  Service: Orthopedics;  Laterality: Right;    Current Medications: Current Meds  Medication Sig  . aspirin EC 81 MG tablet Take 81 mg by mouth daily.  . carvedilol (COREG) 6.25 MG tablet TAKE 1 TABLET BY MOUTH TWICE A DAY  . cholecalciferol (VITAMIN D) 1000 units tablet Take 1,000 Units by mouth daily.  Marland Kitchen ezetimibe (ZETIA) 10 MG tablet Take 1 tablet (10 mg total) by mouth daily.  Marland Kitchen glipiZIDE (GLUCOTROL XL) 10 MG 24 hr tablet Take 10  mg by mouth daily.  . mupirocin ointment (BACTROBAN) 2 % Apply 1 application topically as needed.  . nitroGLYCERIN (NITROSTAT) 0.4 MG SL tablet Place 1 tablet (0.4 mg total) under the tongue as needed for chest pain.  . pravastatin (PRAVACHOL) 80 MG tablet Take 1 tablet (80 mg total) by mouth daily.  . ramipril (ALTACE) 10 MG capsule TAKE 1 CAPSULE BY MOUTH EVERY DAY  . tamsulosin (FLOMAX) 0.4 MG CAPS capsule Take 0.4 mg by mouth daily.  . Zinc Sulfate (ZINC 15 PO) Take by mouth.     Allergies:   Patient has no known allergies.   Social History   Socioeconomic History  .  Marital status: Married    Spouse name: Not on file  . Number of children: Not on file  . Years of education: Not on file  . Highest education level: Not on file  Occupational History  . Not on file  Tobacco Use  . Smoking status: Former Games developer  . Smokeless tobacco: Never Used  Vaping Use  . Vaping Use: Never used  Substance and Sexual Activity  . Alcohol use: No  . Drug use: No  . Sexual activity: Not on file  Other Topics Concern  . Not on file  Social History Narrative  . Not on file   Social Determinants of Health   Financial Resource Strain:   . Difficulty of Paying Living Expenses: Not on file  Food Insecurity:   . Worried About Programme researcher, broadcasting/film/video in the Last Year: Not on file  . Ran Out of Food in the Last Year: Not on file  Transportation Needs:   . Lack of Transportation (Medical): Not on file  . Lack of Transportation (Non-Medical): Not on file  Physical Activity:   . Days of Exercise per Week: Not on file  . Minutes of Exercise per Session: Not on file  Stress:   . Feeling of Stress : Not on file  Social Connections:   . Frequency of Communication with Friends and Family: Not on file  . Frequency of Social Gatherings with Friends and Family: Not on file  . Attends Religious Services: Not on file  . Active Member of Clubs or Organizations: Not on file  . Attends Banker Meetings: Not on file  . Marital Status: Not on file     Family History: The patient's family history includes Heart disease in his father and mother; Rheum arthritis in his father. ROS:   Please see the history of present illness.    All 14 point review of systems negative except as described per history of present illness  EKGs/Labs/Other Studies Reviewed:      Recent Labs: 09/06/2019: ALT 20  Recent Lipid Panel    Component Value Date/Time   CHOL 164 09/06/2019 1030   TRIG 289 (H) 09/06/2019 1030   HDL 48 09/06/2019 1030   CHOLHDL 3.4 09/06/2019 1030   LDLCALC 70  09/06/2019 1030    Physical Exam:    VS:  BP (!) 148/84 (BP Location: Right Arm, Patient Position: Sitting, Cuff Size: Normal)   Pulse 62   Ht 5\' 11"  (1.803 m)   Wt 192 lb 9.6 oz (87.4 kg)   SpO2 97%   BMI 26.86 kg/m     Wt Readings from Last 3 Encounters:  04/12/20 192 lb 9.6 oz (87.4 kg)  11/11/18 195 lb (88.5 kg)  05/25/18 199 lb 12.8 oz (90.6 kg)     GEN:  Well nourished, well  developed in no acute distress HEENT: Normal NECK: No JVD; No carotid bruits LYMPHATICS: No lymphadenopathy CARDIAC: RRR, no murmurs, no rubs, no gallops RESPIRATORY:  Clear to auscultation without rales, wheezing or rhonchi  ABDOMEN: Soft, non-tender, non-distended MUSCULOSKELETAL:  No edema; No deformity  SKIN: Warm and dry LOWER EXTREMITIES: no swelling NEUROLOGIC:  Alert and oriented x 3 PSYCHIATRIC:  Normal affect   ASSESSMENT:    1. Ischemic cardiomyopathy   2. Coronary artery disease involving native coronary artery of native heart without angina pectoris   3. Dyslipidemia    PLAN:    In order of problems listed above:  1. Ischemic cardiomyopathy we will repeat echocardiogram to recheck left ventricle ejection fraction.  He is on Altace which I will continue, I discovered today that he takes carvedilol every fifth day.  He was told to reduce the dose and this is the way he does it.  I told him this is an appropriate.  I will reduce his dose of carvedilol from 6.25 twice daily to 3.125 twice daily and asked him to continue with that setting. 2. Coronary artery disease stable asymptomatic. 3. Dyslipidemia: We will check his fasting blood profile today.  I did review his K PN from March cholesterols LDL 70.   Medication Adjustments/Labs and Tests Ordered: Current medicines are reviewed at length with the patient today.  Concerns regarding medicines are outlined above.  No orders of the defined types were placed in this encounter.  Medication changes: No orders of the defined types were  placed in this encounter.   Signed, Georgeanna Lea, MD, [facility] 04/12/2020 11:05 AM    [facility]

## 2020-04-12 NOTE — Addendum Note (Signed)
Addended by: Hazle Quant on: 04/12/2020 11:27 AM   Modules accepted: Orders

## 2020-04-13 LAB — LIPID PANEL
Chol/HDL Ratio: 2.8 ratio (ref 0.0–5.0)
Cholesterol, Total: 129 mg/dL (ref 100–199)
HDL: 46 mg/dL (ref >39)
LDL Chol Calc (NIH): 51 mg/dL (ref 0–99)
Triglycerides: 196 mg/dL — ABNORMAL HIGH (ref 0–149)
VLDL Cholesterol Cal: 32 mg/dL (ref 5–40)

## 2020-05-04 ENCOUNTER — Other Ambulatory Visit: Payer: Medicare Other

## 2020-05-09 ENCOUNTER — Other Ambulatory Visit: Payer: Medicare Other

## 2020-05-19 ENCOUNTER — Other Ambulatory Visit: Payer: Self-pay | Admitting: Cardiology

## 2020-06-05 ENCOUNTER — Other Ambulatory Visit: Payer: Self-pay | Admitting: Cardiology

## 2020-06-05 NOTE — Telephone Encounter (Signed)
Rx refill sent to pharmacy. 

## 2020-10-05 ENCOUNTER — Other Ambulatory Visit: Payer: Self-pay

## 2020-10-09 ENCOUNTER — Ambulatory Visit: Payer: Medicare Other | Admitting: Cardiology

## 2020-10-17 ENCOUNTER — Other Ambulatory Visit: Payer: Self-pay

## 2020-10-17 ENCOUNTER — Encounter: Payer: Self-pay | Admitting: Cardiology

## 2020-10-17 ENCOUNTER — Ambulatory Visit (INDEPENDENT_AMBULATORY_CARE_PROVIDER_SITE_OTHER): Payer: Medicare Other | Admitting: Cardiology

## 2020-10-17 VITALS — BP 146/90 | HR 64 | Ht 71.0 in | Wt 190.0 lb

## 2020-10-17 DIAGNOSIS — R7303 Prediabetes: Secondary | ICD-10-CM

## 2020-10-17 DIAGNOSIS — I2510 Atherosclerotic heart disease of native coronary artery without angina pectoris: Secondary | ICD-10-CM

## 2020-10-17 DIAGNOSIS — I255 Ischemic cardiomyopathy: Secondary | ICD-10-CM | POA: Diagnosis not present

## 2020-10-17 DIAGNOSIS — E782 Mixed hyperlipidemia: Secondary | ICD-10-CM

## 2020-10-17 NOTE — Patient Instructions (Signed)
Medication Instructions:  Your physician recommends that you continue on your current medications as directed. Please refer to the Current Medication list given to you today.  *If you need a refill on your cardiac medications before your next appointment, please call your pharmacy*   Lab Work: None If you have labs (blood work) drawn today and your tests are completely normal, you will receive your results only by: MyChart Message (if you have MyChart) OR A paper copy in the mail If you have any lab test that is abnormal or we need to change your treatment, we will call you to review the results.   Testing/Procedures: Your physician has requested that you have an echocardiogram. Echocardiography is a painless test that uses sound waves to create images of your heart. It provides your doctor with information about the size and shape of your heart and how well your heart's chambers and valves are working. This procedure takes approximately one hour. There are no restrictions for this procedure.    Follow-Up: At [facility], you and your health needs are our priority.  As part of our continuing mission to provide you with exceptional heart care, we have created designated Provider Care Teams.  These Care Teams include your primary Cardiologist (physician) and Advanced Practice Providers (APPs -  Physician Assistants and Nurse Practitioners) who all work together to provide you with the care you need, when you need it.  We recommend signing up for the patient portal called "MyChart".  Sign up information is provided on this After Visit Summary.  MyChart is used to connect with patients for Virtual Visits (Telemedicine).  Patients are able to view lab/test results, encounter notes, upcoming appointments, etc.  Non-urgent messages can be sent to your provider as well.   To learn more about what you can do with MyChart, go to [email or web address].    Your next appointment:   4 month(s)  The  format for your next appointment:   In Person  Provider:   Robert Krasowski, MD   Other Instructions   

## 2020-10-17 NOTE — Progress Notes (Signed)
Echo

## 2020-10-17 NOTE — Progress Notes (Signed)
Cardiology Office Note:    Date:  10/17/2020   ID:  Steven Dixon, DOB 1941-02-12, MRN [id number]  PCP:  Raynelle Jan., MD  Cardiologist:  Gypsy Balsam, MD    Referring MD: Raynelle Jan., MD   Chief Complaint  Patient presents with  . Follow-up  Doing as expected  History of Present Illness:    Steven Dixon is a 80 y.o. male with past medical history significant for ischemic cardiomyopathy ejection fraction 45%, status post PTCA and stenting of LAD and diagonal branch in 2009, essential hypertension, dyslipidemia.  He comes today to my office for regular follow-up.  His wife does have cirrhosis and she is dying.  He said she could just few days left.  And he is taking care of her and obviously shaken by that but seems to be holding quite well.  Denies have any cardiac complaints there is no chest pain tightness squeezing pressure burning chest no dizziness no passing out.  Past Medical History:  Diagnosis Date  . Arthritis   . Atherosclerotic heart disease of native coronary artery without angina pectoris 06/21/2015   Overview:  Stent to LAD and diagonal 1 in September 2009  Formatting of this note might be different from the original. STEND IN LAD AND D1 IN 2009 Formatting of this note might be different from the original. Stent to LAD and diagonal 1 in September 2009  . Benign prostatic hyperplasia 02/16/2020  . Bradycardia 01/12/2019  . Cardiomyopathy (HCC)   . CKD (chronic kidney disease), stage III (HCC) 02/06/2016  . Coronary artery disease    has 2 stents in heart- [facility]  . Dyslipidemia 06/21/2015  . Essential hypertension 04/22/2016  . 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 Hyperlipidemia   . Hypertension   . Ischemic cardiomyopathy 02/06/2016   Formatting of this note might be different from the original. EF 50-55% on ECHO 05/11/2018 with apical hypokinesis and diastolic dysfunction.  . MI (mitral incompetence)   .  Myocardial infarction (HCC)   . 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 03/15/2008  . Pre-diabetes   . Primary localized osteoarthritis of knee    Right  . Primary localized osteoarthritis of right knee 08/14/2017  . Type 2 diabetes mellitus (HCC) 01/23/2017   Overview:  Diet controlled  . Wellness examination 05/13/2017    Past Surgical History:  Procedure Laterality Date  . APPENDECTOMY    . CARDIAC CATHETERIZATION     Stents X1  . CATARACT EXTRACTION    . CORONARY STENT PLACEMENT    . EYE SURGERY     Bilateral cataracts  . JOINT REPLACEMENT    . KNEE CLOSED REDUCTION Right 10/14/2017   Procedure: CLOSED MANIPULATION RIGHT KNEE;  Surgeon: Frederico Hamman, MD;  Location: [facility];  Service: Orthopedics;  Laterality: Right;  . KNEE SURGERY    . ROTATOR CUFF REPAIR    . TOTAL KNEE ARTHROPLASTY Right 08/14/2017  . TOTAL KNEE ARTHROPLASTY Right 08/14/2017   Procedure: RIGHT TOTAL KNEE ARTHROPLASTY;  Surgeon: Frederico Hamman, MD;  Location: [facility] OR;  Service: Orthopedics;  Laterality: Right;    Current Medications: Current Meds  Medication Sig  . amLODipine (NORVASC) 2.5 MG tablet Take 1 tablet by mouth daily.  . carvedilol (COREG) 3.125 MG tablet Take 1 tablet (3.125 mg total) by mouth 2 (two) times daily.  . cholecalciferol (VITAMIN D)  1000 units tablet Take 1,000 Units by mouth daily.  . diclofenac Sodium (VOLTAREN) 1 % GEL Apply 1 application topically as needed (pain).  Marland Kitchen ezetimibe (ZETIA) 10 MG tablet TAKE 1 TABLET BY MOUTH EVERY DAY (Patient taking differently: Take 10 mg by mouth daily.)  . glipiZIDE (GLUCOTROL XL) 10 MG 24 hr tablet Take 10 mg by mouth daily.  . mupirocin ointment (BACTROBAN) 2 % Apply 1 application topically as needed (rash).  . nitroGLYCERIN (NITROSTAT) 0.4 MG SL tablet Place 1 tablet (0.4 mg total) under the tongue as needed for chest pain.  . pravastatin  (PRAVACHOL) 80 MG tablet Take 1 tablet (80 mg total) by mouth daily.  . ramipril (ALTACE) 10 MG capsule TAKE 1 CAPSULE BY MOUTH EVERY DAY (Patient taking differently: Take 10 mg by mouth daily. TAKE 1 CAPSULE BY MOUTH EVERY DAY)  . tamsulosin (FLOMAX) 0.4 MG CAPS capsule Take 0.4 mg by mouth daily.     Allergies:   Patient has no known allergies.   Social History   Socioeconomic History  . Marital status: Married    Spouse name: Not on file  . Number of children: Not on file  . Years of education: Not on file  . Highest education level: Not on file  Occupational History  . Not on file  Tobacco Use  . Smoking status: Former Games developer  . Smokeless tobacco: Never Used  Vaping Use  . Vaping Use: Never used  Substance and Sexual Activity  . Alcohol use: No  . Drug use: No  . Sexual activity: Not on file  Other Topics Concern  . Not on file  Social History Narrative  . Not on file   Social Determinants of Health   Financial Resource Strain: Not on file  Food Insecurity: Not on file  Transportation Needs: Not on file  Physical Activity: Not on file  Stress: Not on file  Social Connections: Not on file     Family History: The patient's family history includes Heart disease in his father and mother; Rheum arthritis in his father. ROS:   Please see the history of present illness.    All 14 point review of systems negative except as described per history of present illness  EKGs/Labs/Other Studies Reviewed:      Recent Labs: No results found for requested labs within last 8760 hours.  Recent Lipid Panel    Component Value Date/Time   CHOL 129 04/12/2020 0000   TRIG 196 (H) 04/12/2020 0000   HDL 46 04/12/2020 0000   CHOLHDL 2.8 04/12/2020 0000   LDLCALC 51 04/12/2020 0000    Physical Exam:    VS:  BP (!) 146/90 (BP Location: Left Arm, Patient Position: Sitting)   Pulse 64   Ht 5\' 11"  (1.803 m)   Wt 190 lb (86.2 kg)   SpO2 92%   BMI 26.50 kg/m     Wt Readings  from Last 3 Encounters:  10/17/20 190 lb (86.2 kg)  04/12/20 192 lb 9.6 oz (87.4 kg)  11/11/18 195 lb (88.5 kg)     GEN:  Well nourished, well developed in no acute distress HEENT: Normal NECK: No JVD; No carotid bruits LYMPHATICS: No lymphadenopathy CARDIAC: RRR, no murmurs, no rubs, no gallops RESPIRATORY:  Clear to auscultation without rales, wheezing or rhonchi  ABDOMEN: Soft, non-tender, non-distended MUSCULOSKELETAL:  No edema; No deformity  SKIN: Warm and dry LOWER EXTREMITIES: no swelling NEUROLOGIC:  Alert and oriented x 3 PSYCHIATRIC:  Normal affect   ASSESSMENT:  1. Coronary artery disease involving native coronary artery of native heart without angina pectoris   2. Ischemic cardiomyopathy   3. Pre-diabetes   4. Mixed hyperlipidemia    PLAN:    In order of problems listed above:  1. Coronary disease seems to be stable from that point review and appropriate medication which I will continue.  That involve antiplatelets therapy. 2. Essential hypertension seems to be uncontrolled but I blame on his emotional status today I will not change any of his medication, however, I will see him within the next few months to reassess the situation.  As a part of evaluation echocardiogram will be done it was scheduled last year however never happened. 3. Mixed dyslipidemia, I do have his fasting lipid profile this is from last year October with HDL of 46 LDL 51 with a good cholesterol profile I will continue present management.   Medication Adjustments/Labs and Tests Ordered: Current medicines are reviewed at length with the patient today.  Concerns regarding medicines are outlined above.  No orders of the defined types were placed in this encounter.  Medication changes: No orders of the defined types were placed in this encounter.   Signed, Georgeanna Lea, MD, [facility] 10/17/2020 11:50 AM    [facility]

## 2020-11-19 ENCOUNTER — Ambulatory Visit (INDEPENDENT_AMBULATORY_CARE_PROVIDER_SITE_OTHER): Payer: Medicare Other

## 2020-11-19 ENCOUNTER — Other Ambulatory Visit: Payer: Self-pay

## 2020-11-19 DIAGNOSIS — I255 Ischemic cardiomyopathy: Secondary | ICD-10-CM | POA: Diagnosis not present

## 2020-11-19 DIAGNOSIS — I2510 Atherosclerotic heart disease of native coronary artery without angina pectoris: Secondary | ICD-10-CM | POA: Diagnosis not present

## 2020-11-19 LAB — ECHOCARDIOGRAM COMPLETE
Area-P 1/2: 4.36 cm2
S' Lateral: 2.8 cm

## 2020-11-19 NOTE — Progress Notes (Unsigned)
Complete echocardiogram performed.  Jimmy Ledger Heindl RDCS, RVT  

## 2020-11-27 ENCOUNTER — Telehealth: Payer: Self-pay | Admitting: Cardiology

## 2020-11-27 NOTE — Telephone Encounter (Signed)
Pt is returning call from Friday in regards to his recent Echo results. Please advise

## 2020-11-28 NOTE — Telephone Encounter (Signed)
Called patient informed him of results.  

## 2021-01-30 ENCOUNTER — Other Ambulatory Visit: Payer: Self-pay

## 2021-01-30 MED ORDER — RAMIPRIL 10 MG PO CAPS: 10.0000 mg | ORAL_CAPSULE | Freq: Every day | ORAL | 3 refills | Status: DC

## 2021-01-30 NOTE — Telephone Encounter (Signed)
Ramipril 10 mg # 90 x 3 refills sent to [facility] ( [facility]

## 2021-02-21 ENCOUNTER — Other Ambulatory Visit: Payer: Self-pay

## 2021-02-22 ENCOUNTER — Ambulatory Visit (INDEPENDENT_AMBULATORY_CARE_PROVIDER_SITE_OTHER): Payer: Medicare Other | Admitting: Cardiology

## 2021-02-22 ENCOUNTER — Encounter: Payer: Self-pay | Admitting: Cardiology

## 2021-02-22 ENCOUNTER — Other Ambulatory Visit: Payer: Self-pay

## 2021-02-22 VITALS — BP 132/78 | HR 70 | Ht 71.0 in | Wt 192.6 lb

## 2021-02-22 DIAGNOSIS — E785 Hyperlipidemia, unspecified: Secondary | ICD-10-CM

## 2021-02-22 DIAGNOSIS — I2510 Atherosclerotic heart disease of native coronary artery without angina pectoris: Secondary | ICD-10-CM

## 2021-02-22 DIAGNOSIS — I255 Ischemic cardiomyopathy: Secondary | ICD-10-CM | POA: Diagnosis not present

## 2021-02-22 MED ORDER — SCOPOLAMINE 1 MG/3DAYS TD PT72: 1.0000 | MEDICATED_PATCH | TRANSDERMAL | 0 refills | Status: DC

## 2021-02-22 NOTE — Progress Notes (Signed)
Cardiology Office Note:    Date:  02/22/2021   ID:  [name], DOB 12/19/1940, MRN [id number]  PCP:  Raynelle Jan., MD  Cardiologist:  Gypsy Balsam, MD    Referring MD: Raynelle Jan., MD   No chief complaint on file. Am doing fine, but my wife passed  History of Present Illness:    Steven Dixon is a 80 y.o. 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  He comes today 2 months of follow-up overall doing well he is wife passed in April since that time he is holding quite well they remain married for 36 years.  He does have his family who is here in town and they taking care of him.  He is fine to go to [place] to visit his brother-in-law and he they supposed to sale he is asking me if I can get him anything to prevent him from getting seasick.  1 time in his life he went to the ocean and became violently sick.  Past Medical History:  Diagnosis Date   Arthritis    Atherosclerotic heart disease of native coronary artery without angina pectoris 06/21/2015   Overview:  Stent to LAD and diagonal 1 in September 2009  Formatting of this note might be different from the original. STEND IN LAD AND D1 IN 2009 Formatting of this note might be different from the original. 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 EF 50-55% on ECHO 05/11/2018  with apical hypokinesis and diastolic dysfunction.   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 03/15/2008   Pre-diabetes    Primary localized osteoarthritis of knee    Right   Primary localized osteoarthritis of right knee 08/14/2017   Type 2 diabetes mellitus (HCC) 01/23/2017   Overview:  Diet controlled   Wellness examination 05/13/2017    Past Surgical History:  Procedure Laterality Date   APPENDECTOMY     CARDIAC CATHETERIZATION     Stents X1   CATARACT EXTRACTION     CORONARY STENT PLACEMENT     EYE SURGERY     Bilateral cataracts   JOINT REPLACEMENT     KNEE CLOSED REDUCTION Right 10/14/2017   Procedure: CLOSED MANIPULATION RIGHT KNEE;  Surgeon: Frederico Hamman, MD;  Location: [facility];  Service: Orthopedics;  Laterality: Right;   KNEE SURGERY     ROTATOR CUFF REPAIR     TOTAL KNEE ARTHROPLASTY Right 08/14/2017   TOTAL KNEE ARTHROPLASTY Right 08/14/2017   Procedure: RIGHT TOTAL KNEE ARTHROPLASTY;  Surgeon: Frederico Hamman, MD;  [facility];  Service: Orthopedics;  Laterality: Right;    Current Medications: Current Meds  Medication Sig   amLODipine (NORVASC) 2.5 MG tablet  Take 1 tablet by mouth daily.   atorvastatin (LIPITOR) 10 MG tablet Take 1 tablet by mouth daily.   carvedilol (COREG) 3.125 MG tablet Take 1 tablet (3.125 mg total) by mouth 2 (two) times daily.   cholecalciferol (VITAMIN D) 1000 units tablet Take 1,000 Units by mouth daily.   diclofenac Sodium (VOLTAREN) 1 % GEL Apply 1 application topically as needed (pain).   ezetimibe (ZETIA) 10 MG tablet TAKE 1 TABLET BY MOUTH EVERY DAY (Patient taking differently: Take 10 mg by mouth daily.)   glipiZIDE (GLUCOTROL XL) 10 MG 24 hr tablet Take 10 mg by mouth daily.   mupirocin ointment (BACTROBAN) 2 % Apply 1 application topically as needed (rash).    nitroGLYCERIN (NITROSTAT) 0.4 MG SL tablet Place 1 tablet (0.4 mg total) under the tongue as needed for chest pain.   pravastatin (PRAVACHOL) 80 MG tablet Take 1 tablet (80 mg total) by mouth daily.   ramipril (ALTACE) 10 MG capsule Take 1 capsule (10 mg total) by mouth daily. TAKE 1 CAPSULE BY MOUTH EVERY DAY   tamsulosin (FLOMAX) 0.4 MG CAPS capsule Take 0.4 mg by mouth daily.   Zinc Sulfate (ZINC 15 PO) Take 1 tablet by mouth 3 (three) times a week.     Allergies:   Patient has no known allergies.   Social History   Socioeconomic History   Marital status: Married    Spouse name: Not on file   Number of children: Not on file   Years of education: Not on file   Highest education level: Not on file  Occupational History   Not on file  Tobacco Use   Smoking status: Former   Smokeless tobacco: Never  Vaping Use   Vaping Use: Never used  Substance and Sexual Activity   Alcohol use: No   Drug use: No   Sexual activity: Not on file  Other Topics Concern   Not on file  Social History Narrative   Not on file   Social Determinants of Health   Financial Resource Strain: Not on file  Food Insecurity: Not on file  Transportation Needs: Not on file  Physical Activity: Not on file  Stress: Not on file  Social Connections: Not on file     Family History: The patient's family history includes Heart disease in his father and mother; Rheum arthritis in his father. ROS:   Please see the history of present illness.    All 14 point review of systems negative except as described per history of present illness  EKGs/Labs/Other Studies Reviewed:      Recent Labs: No results found for requested labs within last 8760 hours.  Recent Lipid Panel    Component Value Date/Time   CHOL 129 04/12/2020 0000   TRIG 196 (H) 04/12/2020 0000   HDL 46 04/12/2020 0000   CHOLHDL 2.8 04/12/2020 0000   LDLCALC 51 04/12/2020 0000    Physical Exam:    VS:  BP 132/78 (BP Location: Right Arm,  Patient Position: Sitting, Cuff Size: Normal)   Pulse 70   Ht 5\' 11"  (1.803 m)   Wt 192 lb 9.6 oz (87.4 kg)   SpO2 94%   BMI 26.86 kg/m     Wt Readings from Last 3 Encounters:  02/22/21 192 lb 9.6 oz (87.4 kg)  10/17/20 190 lb (86.2 kg)  04/12/20 192 lb 9.6 oz (87.4 kg)     GEN:  Well nourished, well developed in no acute distress HEENT: Normal NECK: No JVD; No  carotid bruits LYMPHATICS: No lymphadenopathy CARDIAC: RRR, no murmurs, no rubs, no gallops RESPIRATORY:  Clear to auscultation without rales, wheezing or rhonchi  ABDOMEN: Soft, non-tender, non-distended MUSCULOSKELETAL:  No edema; No deformity  SKIN: Warm and dry LOWER EXTREMITIES: no swelling NEUROLOGIC:  Alert and oriented x 3 PSYCHIATRIC:  Normal affect   ASSESSMENT:    1. Coronary artery disease involving native coronary artery of native heart without angina pectoris   2. Ischemic cardiomyopathy   3. Dyslipidemia    PLAN:    In order of problems listed above:  Coronary artery disease stable from that point review we will continue present management. History of ischemic cardiomyopathy, he seems to be compensated, he is on Coreg as well as Altace which I will continue.  Next time I see him we will repeat echocardiogram to reassess left ventricle ejection fraction. Dyslipidemia I did review his K PN which show me his LDL 51 HDL 46 that is a good cholesterol profile he is taking Lipitor 10 which I will continue. He is asked me to give him some antinausea medication for sailing trip.  I offered him a scopolamine patch however I warned him about potential side effects which include dry mouth, trouble with accommodation of his eyes as well as urinary retention.  He does have some problem urinating therefore ask him to try patch before he goes on the history.  Very rare situation that people hallucinate with scopolamine patch therefore I want him to try it before he goes sailing.   Medication Adjustments/Labs and Tests  Ordered: Current medicines are reviewed at length with the patient today.  Concerns regarding medicines are outlined above.  No orders of the defined types were placed in this encounter.  Medication changes: No orders of the defined types were placed in this encounter.   Signed, Georgeanna Lea, MD, [facility] 02/22/2021 2:00 PM    [facility]

## 2021-02-22 NOTE — Patient Instructions (Addendum)
Medication Instructions:  Your physician has recommended you make the following change in your medication:  START:  Scopolamine apply one patch onto the skin every 3 days as needed for seasickness.   *If you need a refill on your cardiac medications before your next appointment, please call your pharmacy*   Lab Work: None If you have labs (blood work) drawn today and your tests are completely normal, you will receive your results only by: MyChart Message (if you have MyChart) OR A paper copy in the mail If you have any lab test that is abnormal or we need to change your treatment, we will call you to review the results.   Testing/Procedures: None   Follow-Up: At [facility], you and your health needs are our priority.  As part of our continuing mission to provide you with exceptional heart care, we have created designated Provider Care Teams.  These Care Teams include your primary Cardiologist (physician) and Advanced Practice Providers (APPs -  Physician Assistants and Nurse Practitioners) who all work together to provide you with the care you need, when you need it.  We recommend signing up for the patient portal called "MyChart".  Sign up information is provided on this After Visit Summary.  MyChart is used to connect with patients for Virtual Visits (Telemedicine).  Patients are able to view lab/test results, encounter notes, upcoming appointments, etc.  Non-urgent messages can be sent to your provider as well.   To learn more about what you can do with MyChart, go to [email or web address].    Your next appointment:   6 month(s)  The format for your next appointment:   In Person  Provider:   Gypsy Balsam, MD   Other Instructions

## 2021-02-22 NOTE — Addendum Note (Signed)
Addended by: Delorse Limber I on: 02/22/2021 02:07 PM   Modules accepted: Orders

## 2021-08-16 ENCOUNTER — Encounter: Payer: Self-pay | Admitting: Cardiology

## 2021-08-16 ENCOUNTER — Ambulatory Visit (INDEPENDENT_AMBULATORY_CARE_PROVIDER_SITE_OTHER): Payer: Medicare Other | Admitting: Cardiology

## 2021-08-16 ENCOUNTER — Other Ambulatory Visit: Payer: Self-pay

## 2021-08-16 VITALS — BP 126/84 | HR 118 | Ht 70.0 in | Wt 188.8 lb

## 2021-08-16 DIAGNOSIS — E785 Hyperlipidemia, unspecified: Secondary | ICD-10-CM

## 2021-08-16 DIAGNOSIS — I255 Ischemic cardiomyopathy: Secondary | ICD-10-CM

## 2021-08-16 DIAGNOSIS — I480 Paroxysmal atrial fibrillation: Secondary | ICD-10-CM | POA: Diagnosis not present

## 2021-08-16 DIAGNOSIS — I10 Essential (primary) hypertension: Secondary | ICD-10-CM

## 2021-08-16 MED ORDER — APIXABAN 5 MG PO TABS: 5.0000 mg | ORAL_TABLET | Freq: Two times a day (BID) | ORAL | 2 refills | Status: DC

## 2021-08-16 NOTE — Progress Notes (Signed)
Cardiology Office Note:    Date:  08/16/2021   ID:  DIYAN JARMIN, DOB 1941-04-04, MRN [id number]  PCP:  Raynelle Jan., MD  Cardiologist:  Gypsy Balsam, MD    Referring MD: Raynelle Jan., MD   Chief Complaint  Patient presents with   Atrial Fibrillation         History of Present Illness:    Steven Dixon is a 81 y.o. 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   Apparently for week he has been feeling sick.  He got cough some fever end up going to urgent care today he was found to have bronchitis antibiotic has been given with cough medications however EKG performed at the office showed atrial fibrillation which is new discovery.  He denies have any chest pain tightness squeezing pressure burning chest.  Just feels overall lousy which is of course related most likely his sickness.  Denies have any dizziness or passing out.  Past Medical History:  Diagnosis Date   Arthritis    Atherosclerotic heart disease of native coronary artery without angina pectoris 06/21/2015   Overview:  Stent to LAD and diagonal 1 in September 2009  Formatting of this note might be different from the original. STEND IN LAD AND D1 IN 2009 Formatting of this note might be different from the original. 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 EF 50-55%  on ECHO 05/11/2018 with apical hypokinesis and diastolic dysfunction.   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 03/15/2008   Pre-diabetes    Primary localized osteoarthritis of knee    Right   Primary localized osteoarthritis of right knee 08/14/2017   Type 2 diabetes mellitus (HCC) 01/23/2017   Overview:  Diet controlled   Wellness examination 05/13/2017    Past Surgical History:  Procedure Laterality Date   APPENDECTOMY     CARDIAC CATHETERIZATION     Stents X1   CATARACT EXTRACTION     CORONARY STENT PLACEMENT     EYE SURGERY     Bilateral cataracts   JOINT REPLACEMENT     KNEE CLOSED REDUCTION Right 10/14/2017   Procedure: CLOSED MANIPULATION RIGHT KNEE;  Surgeon: Frederico Hamman, MD;  Location: [facility];  Service: Orthopedics;  Laterality: Right;   KNEE SURGERY     ROTATOR CUFF REPAIR     TOTAL KNEE ARTHROPLASTY Right 08/14/2017   TOTAL KNEE ARTHROPLASTY Right 08/14/2017   Procedure: RIGHT TOTAL KNEE ARTHROPLASTY;  Surgeon: Frederico Hamman, MD;  Location: [facility] OR;  Service: Orthopedics;  Laterality: Right;    Current Medications: Current Meds  Medication Sig   amLODipine (NORVASC) 2.5 MG tablet Take 1 tablet by mouth daily.  atorvastatin (LIPITOR) 10 MG tablet Take 1 tablet by mouth daily.   carvedilol (COREG) 3.125 MG tablet Take 1 tablet (3.125 mg total) by mouth 2 (two) times daily.   cholecalciferol (VITAMIN D) 1000 units tablet Take 1,000 Units by mouth daily.   diclofenac Sodium (VOLTAREN) 1 % GEL Apply 1 application topically as needed (pain).   ezetimibe (ZETIA) 10 MG tablet TAKE 1 TABLET BY MOUTH EVERY DAY (Patient taking differently: Take 10 mg by mouth daily.)   glipiZIDE (GLUCOTROL XL) 10 MG 24 hr tablet Take 10 mg by mouth daily.   mupirocin ointment (BACTROBAN) 2 % Apply 1 application topically as  needed (rash).   nitroGLYCERIN (NITROSTAT) 0.4 MG SL tablet Place 1 tablet (0.4 mg total) under the tongue as needed for chest pain.   pravastatin (PRAVACHOL) 80 MG tablet Take 1 tablet (80 mg total) by mouth daily.   ramipril (ALTACE) 10 MG capsule Take 1 capsule (10 mg total) by mouth daily. TAKE 1 CAPSULE BY MOUTH EVERY DAY   scopolamine (TRANSDERM-SCOP, 1.5 MG,) 1 MG/3DAYS Place 1 patch (1.5 mg total) onto the skin every 3 (three) days.   tamsulosin (FLOMAX) 0.4 MG CAPS capsule Take 0.4 mg by mouth daily.   Zinc Sulfate (ZINC 15 PO) Take 1 tablet by mouth 3 (three) times a week.     Allergies:   Patient has no known allergies.   Social History   Socioeconomic History   Marital status: Married    Spouse name: Not on file   Number of children: Not on file   Years of education: Not on file   Highest education level: Not on file  Occupational History   Not on file  Tobacco Use   Smoking status: Former   Smokeless tobacco: Never  Vaping Use   Vaping Use: Never used  Substance and Sexual Activity   Alcohol use: No   Drug use: No   Sexual activity: Not on file  Other Topics Concern   Not on file  Social History Narrative   Not on file   Social Determinants of Health   Financial Resource Strain: Not on file  Food Insecurity: Not on file  Transportation Needs: Not on file  Physical Activity: Not on file  Stress: Not on file  Social Connections: Not on file     Family History: The patient's family history includes Heart disease in his father and mother; Rheum arthritis in his father. ROS:   Please see the history of present illness.    All 14 point review of systems negative except as described per history of present illness  EKGs/Labs/Other Studies Reviewed:      Recent Labs: No results found for requested labs within last 8760 hours.  Recent Lipid Panel    Component Value Date/Time   CHOL 129 04/12/2020 0000   TRIG 196 (H) 04/12/2020 0000   HDL 46 04/12/2020  0000   CHOLHDL 2.8 04/12/2020 0000   LDLCALC 51 04/12/2020 0000    Physical Exam:    VS:  BP 126/84 (BP Location: Right Arm, Patient Position: Sitting)    Pulse (!) 118    Ht 5\' 10"  (1.778 m)    Wt 188 lb 12.8 oz (85.6 kg)    SpO2 96%    BMI 27.09 kg/m     Wt Readings from Last 3 Encounters:  08/16/21 188 lb 12.8 oz (85.6 kg)  02/22/21 192 lb 9.6 oz (87.4 kg)  10/17/20 190 lb (86.2 kg)  GEN:  Well nourished, well developed in no acute distress HEENT: Normal NECK: No JVD; No carotid bruits LYMPHATICS: No lymphadenopathy CARDIAC: Irregularly irregular, no murmurs, no rubs, no gallops RESPIRATORY:  Clear to auscultation without rales, wheezing or rhonchi  ABDOMEN: Soft, non-tender, non-distended MUSCULOSKELETAL:  No edema; No deformity  SKIN: Warm and dry LOWER EXTREMITIES: no swelling NEUROLOGIC:  Alert and oriented x 3 PSYCHIATRIC:  Normal affect   ASSESSMENT:    1. Ischemic cardiomyopathy   2. Paroxysmal atrial fibrillation (HCC)   3. Essential hypertension   4. Dyslipidemia    PLAN:    In order of problems listed above:  Atrial fibrillation which is first documented episode.  Rate controlled.  He does have resting bradycardia with heart rate going down to 50 therefore I will not use any beta-blocker.  He need to be anticoagulated his CHADS2 Vascor equals 3.  Will initiate Eliquis 5 twice daily we will check his stool for guaiac CBC Chem-7.  I bring him back in about a month if he still in atrial fibrillation we talk about cardioversion. Coronary artery disease stable denies have any chest pain tightness squeezing pressure burning chest. History of ischemic cardiomyopathy.  Echocardiogram last done done in May showed preserved left ventricle ejection fraction.  We will continue present management.   Medication Adjustments/Labs and Tests Ordered: Current medicines are reviewed at length with the patient today.  Concerns regarding medicines are outlined above.  No  orders of the defined types were placed in this encounter.  Medication changes: No orders of the defined types were placed in this encounter.   Signed, Georgeanna Lea, MD, [facility] 08/16/2021 4:49 PM    [facility]

## 2021-08-16 NOTE — Patient Instructions (Signed)
Medication Instructions:  Your physician has recommended you make the following change in your medication:   START: Eliquis 5 mg twice daily  *If you need a refill on your cardiac medications before your next appointment, please call your pharmacy*   Lab Work: Your physician recommends that you return for lab work in: Labs today: CBC, BMP, [place] for stool If you have labs (blood work) drawn today and your tests are completely normal, you will receive your results only by: MyChart Message (if you have MyChart) OR A paper copy in the mail If you have any lab test that is abnormal or we need to change your treatment, we will call you to review the results.   Testing/Procedures: None   Follow-Up: At [facility], you and your health needs are our priority.  As part of our continuing mission to provide you with exceptional heart care, we have created designated Provider Care Teams.  These Care Teams include your primary Cardiologist (physician) and Advanced Practice Providers (APPs -  Physician Assistants and Nurse Practitioners) who all work together to provide you with the care you need, when you need it.  We recommend signing up for the patient portal called "MyChart".  Sign up information is provided on this After Visit Summary.  MyChart is used to connect with patients for Virtual Visits (Telemedicine).  Patients are able to view lab/test results, encounter notes, upcoming appointments, etc.  Non-urgent messages can be sent to your provider as well.   To learn more about what you can do with MyChart, go to [email or web address].    Your next appointment:   1 month(s)  The format for your next appointment:   In Person  Provider:   Gypsy Balsam, MD    Other Instructions None

## 2021-08-17 LAB — BASIC METABOLIC PANEL
BUN/Creatinine Ratio: 16 (ref 10–24)
BUN: 35 mg/dL — ABNORMAL HIGH (ref 8–27)
CO2: 20 mmol/L (ref 20–29)
Calcium: 9 mg/dL (ref 8.6–10.2)
Chloride: 101 mmol/L (ref 96–106)
Creatinine, Ser: 2.14 mg/dL — ABNORMAL HIGH (ref 0.76–1.27)
Glucose: 212 mg/dL — ABNORMAL HIGH (ref 70–99)
Potassium: 4 mmol/L (ref 3.5–5.2)
Sodium: 138 mmol/L (ref 134–144)
eGFR: 31 mL/min/{1.73_m2} — ABNORMAL LOW (ref >59)

## 2021-08-17 LAB — CBC
Hematocrit: 45.4 % (ref 37.5–51.0)
Hemoglobin: 15.6 g/dL (ref 13.0–17.7)
MCH: 29.4 pg (ref 26.6–33.0)
MCHC: 34.4 g/dL (ref 31.5–35.7)
MCV: 86 fL (ref 79–97)
Platelets: 236 10*3/uL (ref 150–450)
RBC: 5.31 x10E6/uL (ref 4.14–5.80)
RDW: 13.4 % (ref 11.6–15.4)
WBC: 4.6 10*3/uL (ref 3.4–10.8)

## 2021-08-21 LAB — FECAL OCCULT BLOOD, IMMUNOCHEMICAL: Fecal Occult Bld: POSITIVE — AB

## 2021-08-28 ENCOUNTER — Telehealth: Payer: Self-pay

## 2021-08-28 NOTE — Telephone Encounter (Signed)
-----   Message from Park Liter, MD sent at 08/18/2021  9:46 PM EST ----- Regarding: FW: Creat up, lower altace to 5 mg daily, chem 7 in 1 week ----- Message ----- From: Interface, [facility] Lab Results In Sent: 08/17/2021   5:39 AM EST To: Park Liter, MD

## 2021-08-28 NOTE — Telephone Encounter (Signed)
Unable to reach the patient on multiple number on file. Will continue efforts

## 2021-09-02 ENCOUNTER — Ambulatory Visit: Payer: Medicare Other | Admitting: Cardiology

## 2021-09-05 ENCOUNTER — Telehealth: Payer: Self-pay | Admitting: Cardiology

## 2021-09-05 DIAGNOSIS — R195 Other fecal abnormalities: Secondary | ICD-10-CM

## 2021-09-05 DIAGNOSIS — I10 Essential (primary) hypertension: Secondary | ICD-10-CM

## 2021-09-05 DIAGNOSIS — Z79899 Other long term (current) drug therapy: Secondary | ICD-10-CM

## 2021-09-05 DIAGNOSIS — I255 Ischemic cardiomyopathy: Secondary | ICD-10-CM

## 2021-09-05 NOTE — Telephone Encounter (Signed)
Patient received letter in was returning call. Please advise ?

## 2021-09-06 NOTE — Telephone Encounter (Signed)
Full VM 

## 2021-09-09 MED ORDER — RAMIPRIL 5 MG PO CAPS: 5.0000 mg | ORAL_CAPSULE | Freq: Every day | ORAL | 3 refills | Status: DC

## 2021-09-09 NOTE — Telephone Encounter (Signed)
Spoke with pt on 3/3 and advised of results and changes to medications. Pt states he would like another stool test before seeing GI. Pt verbalized understanding and had no additional questions. ?

## 2021-09-09 NOTE — Addendum Note (Signed)
Addended bySherri Rad C on: 09/09/2021 10:34 AM ? ? Modules accepted: Orders ? ?

## 2021-09-09 NOTE — Telephone Encounter (Addendum)
I called and spoke with the patient. ? ?I advised that I could see that a letter was sent to him regarding his most recent lab work with Dr. Bing Matter on 08/16/21 (BMP/ CBC) & Hemoccult (2/14). ? ?Ladonna Snide had mailed the patient a letter on 08/29/21 stating: ?Below are the results from your recent visit: ? ?Creatine is up, lower altace to 5 mg daily, chem 7 in 1 week. ? ?Your physician recommends that you return for lab work in: 1 week ?You can come Monday through Friday 8:30 am to 12:00 pm and 1:15 to 4:30. You do not need to make an appointment as the order has already been placed. ? ?I have reviewed the results of his BMP/ CBC with him. ?He states he was already aware of MD recommendations as stated above to decrease his ramipril to 5 mg once daily. ?He states he is cutting his 10 mg ramipril in half. ?I advised the patient that this is a capsule- he confirmed he is cutting the capsule in half. ?I advised him that it will be very difficult to get a regulated dose cutting a capsule in half with the way the powder can shift from one side of the capsule to the other. ? ?I advised I would like to send in ramipril 5 mg once daily for him to the local pharmacy. ?He is agreeable with a local RX and if his lab work is ok next week, he is aware we can update the RX to his pharmacy. ? ?I also discussed him positive hemoccult with him. ?He states he was aware of these results and advised whomever he spoke with last week, that he would like this repeated prior to pursuing this any further.  ?He states he was told he can have the BMP/ hemoccult repeated this coming Monday. ?I will add on a CBC as well just to recheck his Hbg.  ?He denies any physical signs/ symptoms of blood in his stool. ? ?I advised the patient I will forward to [place] to as an FYI that I spoke with him. ? ?No lab orders have been entered for Monday (3/13) will enter these orders.  ? ?The patient  ?

## 2021-09-16 ENCOUNTER — Ambulatory Visit (INDEPENDENT_AMBULATORY_CARE_PROVIDER_SITE_OTHER): Payer: Medicare Other | Admitting: Cardiology

## 2021-09-16 ENCOUNTER — Encounter: Payer: Self-pay | Admitting: Cardiology

## 2021-09-16 ENCOUNTER — Other Ambulatory Visit: Payer: Self-pay

## 2021-09-16 VITALS — BP 140/92 | HR 66 | Ht 70.0 in | Wt 192.6 lb

## 2021-09-16 DIAGNOSIS — R195 Other fecal abnormalities: Secondary | ICD-10-CM

## 2021-09-16 DIAGNOSIS — I480 Paroxysmal atrial fibrillation: Secondary | ICD-10-CM | POA: Diagnosis not present

## 2021-09-16 DIAGNOSIS — I255 Ischemic cardiomyopathy: Secondary | ICD-10-CM

## 2021-09-16 DIAGNOSIS — I10 Essential (primary) hypertension: Secondary | ICD-10-CM | POA: Diagnosis not present

## 2021-09-16 DIAGNOSIS — Z79899 Other long term (current) drug therapy: Secondary | ICD-10-CM

## 2021-09-16 DIAGNOSIS — I2510 Atherosclerotic heart disease of native coronary artery without angina pectoris: Secondary | ICD-10-CM | POA: Diagnosis not present

## 2021-09-16 DIAGNOSIS — E782 Mixed hyperlipidemia: Secondary | ICD-10-CM

## 2021-09-16 NOTE — Patient Instructions (Signed)

## 2021-09-16 NOTE — Progress Notes (Unsigned)
Cardiology Office Note:    Date:  09/16/2021   ID:  Steven Dixon, DOB February 28, 1941, MRN [id number]  PCP:  Raynelle Jan., MD  Cardiologist:  Gypsy Balsam, MD    Referring MD: Raynelle Jan., MD   Chief Complaint  Patient presents with   Medication Management  Doing much better  History of Present Illness:    Steven Dixon is a 81 y.o. male with past medical history significant for coronary artery disease he did have PTCA and stenting done of the diagonal branch in 2009, also history of dyslipidemia, essential hypertension, he was in my office month ago for regular follow-up he was found to be in atrial fibrillation.  Anticoagulation has been initiated.  His CHADS2 Vascor equals 3.  He is coming today for regular follow-up.  He is feeling much better in terms of arrhythmia.  He did not feel much though when he had it and thinking was that probably his atrial fibrillation was caused by chronic sinus issues that he has been struggling for last 3 months.  He received 3 courses of antibiotic as well as some steroids in spite of that still a lot of congestion.  I told him he need to go to see ENT specialist rather than go to urgent care.  In terms of heart he is doing well denies have any chest pain tightness squeezing pressure burning chest no palpitations no dizziness  Past Medical History:  Diagnosis Date   Arthritis    Atherosclerotic heart disease of native coronary artery without angina pectoris 06/21/2015   Overview:  Stent to LAD and diagonal 1 in September 2009  Formatting of this note might be different from the original. STEND IN LAD AND D1 IN 2009 Formatting of this note might be different from the original. 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 EF 50-55% on ECHO 05/11/2018 with apical hypokinesis and diastolic dysfunction.   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 03/15/2008   Pre-diabetes    Primary localized osteoarthritis of knee    Right   Primary localized osteoarthritis of right knee 08/14/2017   Type 2 diabetes mellitus (HCC) 01/23/2017   Overview:  Diet controlled   Wellness examination 05/13/2017    Past Surgical History:  Procedure Laterality Date   APPENDECTOMY     CARDIAC CATHETERIZATION     Stents X1   CATARACT EXTRACTION     CORONARY STENT PLACEMENT     EYE SURGERY     Bilateral cataracts   JOINT REPLACEMENT     KNEE CLOSED REDUCTION Right 10/14/2017   Procedure: CLOSED MANIPULATION RIGHT KNEE;  Surgeon: Frederico Hamman, MD;  Location: [facility];  Service: Orthopedics;  Laterality: Right;   KNEE SURGERY     ROTATOR CUFF REPAIR     TOTAL KNEE ARTHROPLASTY Right 08/14/2017  TOTAL KNEE ARTHROPLASTY Right 08/14/2017   Procedure: RIGHT TOTAL KNEE ARTHROPLASTY;  Surgeon: Frederico Hamman, MD;  Location: [facility] OR;  Service: Orthopedics;  Laterality: Right;    Current Medications: Current Meds  Medication Sig   amLODipine (NORVASC) 2.5 MG tablet Take 1 tablet by mouth daily.   apixaban (ELIQUIS) 5 MG TABS tablet Take 1 tablet (5 mg total) by mouth 2 (two) times daily.   atorvastatin (LIPITOR) 10 MG tablet Take 1 tablet by mouth daily.   carvedilol (COREG) 3.125 MG tablet Take 1 tablet (3.125 mg total) by mouth 2 (two) times daily.   cholecalciferol (VITAMIN D) 1000 units tablet Take 1,000 Units by mouth  daily.   ezetimibe (ZETIA) 10 MG tablet TAKE 1 TABLET BY MOUTH EVERY DAY (Patient taking differently: Take 10 mg by mouth daily.)   glipiZIDE (GLUCOTROL XL) 10 MG 24 hr tablet Take 10 mg by mouth daily.   mupirocin ointment (BACTROBAN) 2 % Apply 1 application topically as needed (rash).   ramipril (ALTACE) 5 MG capsule Take 1 capsule (5 mg total) by mouth daily.   tamsulosin (FLOMAX) 0.4 MG CAPS capsule Take 0.4 mg by mouth daily.   Zinc Sulfate (ZINC 15 PO) Take 1 tablet by mouth 3 (three) times a week.     Allergies:   Patient has no known allergies.   Social History   Socioeconomic History   Marital status: Married    Spouse name: Not on file   Number of children: Not on file   Years of education: Not on file   Highest education level: Not on file  Occupational History   Not on file  Tobacco Use   Smoking status: Former   Smokeless tobacco: Never  Vaping Use   Vaping Use: Never used  Substance and Sexual Activity   Alcohol use: No   Drug use: No   Sexual activity: Not on file  Other Topics Concern   Not on file  Social History Narrative   Not on file   Social Determinants of Health   Financial Resource Strain: Not on file  Food Insecurity: Not on file  Transportation Needs: Not on file  Physical Activity: Not on file  Stress: Not on file  Social Connections: Not on file     Family History: The patient's family history includes Heart disease in his father and mother; Rheum arthritis in his father. ROS:   Please see the history of present illness.    All 14 point review of systems negative except as described per history of present illness  EKGs/Labs/Other Studies Reviewed:      Recent Labs: 08/16/2021: BUN 35; Creatinine, Ser 2.14; Hemoglobin 15.6; Platelets 236; Potassium 4.0; Sodium 138  Recent Lipid Panel    Component Value Date/Time   CHOL 129 04/12/2020 0000   TRIG 196 (H) 04/12/2020 0000   HDL 46 04/12/2020 0000   CHOLHDL 2.8 04/12/2020 0000    LDLCALC 51 04/12/2020 0000    Physical Exam:    VS:  BP (!) 140/92 (BP Location: Left Arm, Patient Position: Sitting)    Pulse 66    Ht 5\' 10"  (1.778 m)    Wt 192 lb 9.6 oz (87.4 kg)    SpO2 93%    BMI 27.64 kg/m     Wt Readings from Last 3 Encounters:  09/16/21 192 lb 9.6 oz (87.4 kg)  08/16/21 188 lb 12.8 oz (85.6 kg)  02/22/21 192 lb 9.6 oz (87.4 kg)     GEN:  Well  nourished, well developed in no acute distress HEENT: Normal NECK: No JVD; No carotid bruits LYMPHATICS: No lymphadenopathy CARDIAC: RRR, no murmurs, no rubs, no gallops RESPIRATORY:  Clear to auscultation without rales, wheezing or rhonchi  ABDOMEN: Soft, non-tender, non-distended MUSCULOSKELETAL:  No edema; No deformity  SKIN: Warm and dry LOWER EXTREMITIES: no swelling NEUROLOGIC:  Alert and oriented x 3 PSYCHIATRIC:  Normal affect   ASSESSMENT:    1. Paroxysmal atrial fibrillation (HCC)   2. Ischemic cardiomyopathy   3. Atherosclerosis of native coronary artery of native heart without angina pectoris   4. Primary hypertension   5. Mixed hyperlipidemia    PLAN:    In order of problems listed above:  Paroxysmal atrial fibrillation he sounds very regular today we will do EKG to confirm the rhythm.  We will continue anticoagulation. History of ischemic cardiomyopathy, his ejection fraction last estimation in May was normal continue present management. Coronary disease stable from that point review no recent reactivation of the problem, Essential hypertension, blood pressure slightly elevated today I will double the dose of amlodipine from 2.5 to 5 mg daily. Dyslipidemia: I did review K PN however the data from 2021, I did review notes per her primary care physician month ago his LDL was 92 HDL was 49.  His triglycerides were elevated but this is time when he was taking steroids.  I will recommend to repeat his fasting lipid profile will issue of sinuses settle down.   Medication Adjustments/Labs and Tests  Ordered: Current medicines are reviewed at length with the patient today.  Concerns regarding medicines are outlined above.  No orders of the defined types were placed in this encounter.  Medication changes: No orders of the defined types were placed in this encounter.   Signed, Georgeanna Lea, MD, [facility] 09/16/2021 1:36 PM    [facility]

## 2021-09-17 LAB — CBC
Hematocrit: 42.2 % (ref 37.5–51.0)
Hemoglobin: 14.2 g/dL (ref 13.0–17.7)
MCH: 28.9 pg (ref 26.6–33.0)
MCHC: 33.6 g/dL (ref 31.5–35.7)
MCV: 86 fL (ref 79–97)
Platelets: 275 10*3/uL (ref 150–450)
RBC: 4.91 x10E6/uL (ref 4.14–5.80)
RDW: 13.3 % (ref 11.6–15.4)
WBC: 8.7 10*3/uL (ref 3.4–10.8)

## 2021-09-17 LAB — BASIC METABOLIC PANEL
BUN/Creatinine Ratio: 13 (ref 10–24)
BUN: 19 mg/dL (ref 8–27)
CO2: 23 mmol/L (ref 20–29)
Calcium: 9.5 mg/dL (ref 8.6–10.2)
Chloride: 102 mmol/L (ref 96–106)
Creatinine, Ser: 1.46 mg/dL — ABNORMAL HIGH (ref 0.76–1.27)
Glucose: 169 mg/dL — ABNORMAL HIGH (ref 70–99)
Potassium: 4.2 mmol/L (ref 3.5–5.2)
Sodium: 138 mmol/L (ref 134–144)
eGFR: 49 mL/min/{1.73_m2} — ABNORMAL LOW (ref >59)

## 2021-09-17 NOTE — Addendum Note (Signed)
Addended by: Roosvelt Harps R on: 09/17/2021 12:39 PM ? ? Modules accepted: Orders ? ?

## 2021-09-27 ENCOUNTER — Other Ambulatory Visit: Payer: Self-pay

## 2021-09-27 ENCOUNTER — Telehealth: Payer: Self-pay | Admitting: Cardiology

## 2021-09-27 DIAGNOSIS — Z0000 Encounter for general adult medical examination without abnormal findings: Secondary | ICD-10-CM

## 2021-09-27 NOTE — Telephone Encounter (Signed)
Pt following up on referral to Dr Marcheta Grammes, he has been to their office and they claim to have not received anything from [place]. Please advise ? ?Thank you! ? ? ?Best number [phone] for pt ?

## 2021-09-27 NOTE — Telephone Encounter (Signed)
Called patient and informed him that I put the referral to Dr. Marcheta Grammes in again and he should be able to schedule his appointment today.Patient was appreciative and had no further questions ?

## 2021-10-02 DIAGNOSIS — J0141 Acute recurrent pansinusitis: Secondary | ICD-10-CM | POA: Insufficient documentation

## 2021-10-30 DIAGNOSIS — M21611 Bunion of right foot: Secondary | ICD-10-CM | POA: Insufficient documentation

## 2021-11-14 ENCOUNTER — Other Ambulatory Visit: Payer: Self-pay | Admitting: Cardiology

## 2021-12-05 ENCOUNTER — Other Ambulatory Visit: Payer: Self-pay | Admitting: Cardiology

## 2021-12-06 ENCOUNTER — Telehealth: Payer: Self-pay | Admitting: Cardiology

## 2021-12-06 MED ORDER — RAMIPRIL 5 MG PO CAPS: 5.0000 mg | ORAL_CAPSULE | Freq: Every day | ORAL | 2 refills | Status: DC

## 2021-12-06 NOTE — Telephone Encounter (Signed)
*  STAT* If patient is at the pharmacy, call can be transferred to refill team.   1. Which medications need to be refilled? (please list name of each medication and dose if known) ramipril (ALTACE) 5 MG capsule  2. Which pharmacy/location (including street and city if local pharmacy) is medication to be sent to?[facility] - [place], [place] - [address]  3. Do they need a 30 day or 90 day supply? 90

## 2022-01-01 LAB — FECAL OCCULT BLOOD, IMMUNOCHEMICAL: Fecal Occult Bld: POSITIVE — AB

## 2022-02-19 ENCOUNTER — Encounter: Payer: Self-pay | Admitting: Cardiology

## 2022-02-19 ENCOUNTER — Ambulatory Visit (INDEPENDENT_AMBULATORY_CARE_PROVIDER_SITE_OTHER): Payer: Medicare Other | Admitting: Cardiology

## 2022-02-19 VITALS — BP 146/80 | HR 58 | Ht 70.0 in | Wt 187.0 lb

## 2022-02-19 DIAGNOSIS — I255 Ischemic cardiomyopathy: Secondary | ICD-10-CM | POA: Diagnosis not present

## 2022-02-19 DIAGNOSIS — I10 Essential (primary) hypertension: Secondary | ICD-10-CM

## 2022-02-19 DIAGNOSIS — I480 Paroxysmal atrial fibrillation: Secondary | ICD-10-CM | POA: Diagnosis not present

## 2022-02-19 DIAGNOSIS — I2510 Atherosclerotic heart disease of native coronary artery without angina pectoris: Secondary | ICD-10-CM | POA: Diagnosis not present

## 2022-02-19 NOTE — Progress Notes (Signed)
Cardiology Office Note:    Date:  02/19/2022   ID:  Steven Dixon, DOB 1941/06/08, MRN [id number]  PCP:  Raynelle Jan., MD  Cardiologist:  Gypsy Balsam, MD    Referring MD: Raynelle Jan., MD   Chief Complaint  Patient presents with   Follow-up    History of Present Illness:    Steven Dixon is a 81 y.o. male with past medical history significant for coronary artery disease.  In 2009 he did have PTCA and stenting of the diagonal branch, also history of paroxysmal atrial fibrillation with CHA2DS2-VASc equals 3, anticoagulated, dyslipidemia, essential hypertension. He is in my office today for follow-up.  Overall he is doing very well.  He denies have any chest pain, tightness, pressure, burning in chest.  He walks and exercise on the regular basis with no difficulties.  He slowed down some because of hot summer weather is getting better he is more active he said he tries to walk 3 times a day.  Past Medical History:  Diagnosis Date   Arthritis    Atherosclerotic heart disease of native coronary artery without angina pectoris 06/21/2015   Overview:  Stent to LAD and diagonal 1 in September 2009  Formatting of this note might be different from the original. STEND IN LAD AND D1 IN 2009 Formatting of this note might be different from the original. 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 EF 50-55% on ECHO 05/11/2018 with apical hypokinesis and diastolic dysfunction.   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 03/15/2008   Pre-diabetes    Primary localized osteoarthritis of knee    Right   Primary localized osteoarthritis of right knee 08/14/2017   Type 2 diabetes mellitus (HCC) 01/23/2017   Overview:  Diet controlled   Wellness examination 05/13/2017    Past Surgical History:  Procedure Laterality Date   APPENDECTOMY     CARDIAC CATHETERIZATION     Stents X1   CATARACT EXTRACTION     CORONARY STENT PLACEMENT     EYE SURGERY     Bilateral cataracts   JOINT REPLACEMENT     KNEE CLOSED REDUCTION Right 10/14/2017   Procedure: CLOSED MANIPULATION RIGHT KNEE;  Surgeon: Frederico Hamman, MD;  Location: [facility];  Service: Orthopedics;  Laterality: Right;   KNEE SURGERY     ROTATOR CUFF REPAIR     TOTAL KNEE ARTHROPLASTY Right 08/14/2017   TOTAL KNEE ARTHROPLASTY Right 08/14/2017   Procedure: RIGHT TOTAL KNEE ARTHROPLASTY;  Surgeon: Frederico Hamman, MD;  Location: [facility] OR;  Service: Orthopedics;  Laterality: Right;    Current Medications: Current Meds  Medication Sig   amLODipine (NORVASC) 5 MG tablet Take 5 mg by mouth daily.   apixaban (ELIQUIS) 5 MG TABS tablet Take 1 tablet (5 mg total) by mouth 2 (two) times daily.  atorvastatin (LIPITOR) 10 MG tablet Take 1 tablet by mouth daily.   carvedilol (COREG) 3.125 MG tablet Take 1 tablet (3.125 mg total) by mouth 2 (two) times daily.   cholecalciferol (VITAMIN D) 1000 units tablet Take 1,000 Units by mouth daily.   ezetimibe (ZETIA) 10 MG tablet TAKE 1 TABLET BY MOUTH EVERY DAY   glipiZIDE (GLUCOTROL XL) 10 MG 24 hr tablet Take 10 mg by mouth daily.   JARDIANCE 10 MG TABS tablet Take 10 mg by mouth daily.   mupirocin ointment (BACTROBAN) 2 % Apply 1 application topically as needed (rash).   ramipril (ALTACE) 5 MG capsule Take 1 capsule (5 mg total) by mouth daily.    tamsulosin (FLOMAX) 0.4 MG CAPS capsule Take 0.4 mg by mouth daily.   Zinc Sulfate (ZINC 15 PO) Take 1 tablet by mouth 3 (three) times a week.     Allergies:   Patient has no known allergies.   Social History   Socioeconomic History   Marital status: Married    Spouse name: Not on file   Number of children: Not on file   Years of education: Not on file   Highest education level: Not on file  Occupational History   Not on file  Tobacco Use   Smoking status: Former   Smokeless tobacco: Never  Vaping Use   Vaping Use: Never used  Substance and Sexual Activity   Alcohol use: No   Drug use: No   Sexual activity: Not on file  Other Topics Concern   Not on file  Social History Narrative   Not on file   Social Determinants of Health   Financial Resource Strain: Not on file  Food Insecurity: Not on file  Transportation Needs: Not on file  Physical Activity: Insufficiently Active (05/13/2017)   Exercise Vital Sign    Days of Exercise per Week: 3 days    Minutes of Exercise per Session: 30 min  Stress: No Stress Concern Present (05/13/2017)   [employer] of Occupational Health - Occupational Stress Questionnaire    Feeling of Stress : Not at all  Social Connections: Not on file     Family History: The patient's family history includes Heart disease in his father and mother; Rheum arthritis in his father. ROS:   Please see the history of present illness.    All 14 point review of systems negative except as described per history of present illness  EKGs/Labs/Other Studies Reviewed:      Recent Labs: 09/16/2021: BUN 19; Creatinine, Ser 1.46; Hemoglobin 14.2; Platelets 275; Potassium 4.2; Sodium 138  Recent Lipid Panel    Component Value Date/Time   CHOL 129 04/12/2020 0000   TRIG 196 (H) 04/12/2020 0000   HDL 46 04/12/2020 0000   CHOLHDL 2.8 04/12/2020 0000   LDLCALC 51 04/12/2020 0000    Physical Exam:    VS:  BP (!) 146/80 (BP Location: Left Arm, Patient  Position: Sitting, Cuff Size: Normal)   Pulse (!) 58   Ht 5\' 10"  (1.778 m)   Wt 187 lb (84.8 kg)   SpO2 94%   BMI 26.83 kg/m     Wt Readings from Last 3 Encounters:  02/19/22 187 lb (84.8 kg)  09/16/21 192 lb 9.6 oz (87.4 kg)  08/16/21 188 lb 12.8 oz (85.6 kg)     GEN:  Well nourished, well developed in no acute distress HEENT: Normal NECK: No JVD; No carotid bruits LYMPHATICS: No lymphadenopathy CARDIAC: RRR, no murmurs, no rubs, no  gallops RESPIRATORY:  Clear to auscultation without rales, wheezing or rhonchi  ABDOMEN: Soft, non-tender, non-distended MUSCULOSKELETAL:  No edema; No deformity  SKIN: Warm and dry LOWER EXTREMITIES: no swelling NEUROLOGIC:  Alert and oriented x 3 PSYCHIATRIC:  Normal affect   ASSESSMENT:    1. Atherosclerosis of native coronary artery of native heart without angina pectoris   2. Ischemic cardiomyopathy   3. Primary hypertension   4. Paroxysmal atrial fibrillation (HCC)    PLAN:    In order of problems listed above:  Coronary disease stable from that point review on appropriate medications.  He is not on antiplatelets therapy since he did not have any recent coronary events and he is on anticoagulation. Paroxysmal atrial fibrillation look like maintain sinus rhythm, denies having any palpitations.  We will continue anticoagulation. History of ischemic cardiomyopathy he is on ACE inhibitor as well as Coreg which I will continue.  Last echocardiogram showed preserved ejection fraction. Dyslipidemia I did review his K PN which show me his LDL of 51 HDL 46.  Still I will call primary care physician to get more updated data.  He is taking moderate intensity statin form of Lipitor 10 which I will continue   Medication Adjustments/Labs and Tests Ordered: Current medicines are reviewed at length with the patient today.  Concerns regarding medicines are outlined above.  No orders of the defined types were placed in this encounter.  Medication  changes: No orders of the defined types were placed in this encounter.   Signed, Georgeanna Lea, MD, [facility] 02/19/2022 8:59 AM    [facility]

## 2022-02-19 NOTE — Patient Instructions (Signed)
Medication Instructions:  Your physician recommends that you continue on your current medications as directed. Please refer to the Current Medication list given to you today.  *If you need a refill on your cardiac medications before your next appointment, please call your pharmacy*   Lab Work: None If you have labs (blood work) drawn today and your tests are completely normal, you will receive your results only by: MyChart Message (if you have MyChart) OR A paper copy in the mail If you have any lab test that is abnormal or we need to change your treatment, we will call you to review the results.   Testing/Procedures: None   Follow-Up: At [facility], you and your health needs are our priority.  As part of our continuing mission to provide you with exceptional heart care, we have created designated Provider Care Teams.  These Care Teams include your primary Cardiologist (physician) and Advanced Practice Providers (APPs -  Physician Assistants and Nurse Practitioners) who all work together to provide you with the care you need, when you need it.  We recommend signing up for the patient portal called "MyChart".  Sign up information is provided on this After Visit Summary.  MyChart is used to connect with patients for Virtual Visits (Telemedicine).  Patients are able to view lab/test results, encounter notes, upcoming appointments, etc.  Non-urgent messages can be sent to your provider as well.   To learn more about what you can do with MyChart, go to [email or web address].    Your next appointment:   6 month(s)  The format for your next appointment:   In Person  Provider:   Robert Krasowski, MD    Other Instructions None  Important Information About Sugar       

## 2022-08-07 ENCOUNTER — Telehealth: Payer: Self-pay | Admitting: *Deleted

## 2022-08-07 NOTE — Telephone Encounter (Signed)
   Pre-operative Risk Assessment    Patient Name: Steven Dixon  DOB: Jul 24, 1940 MRN: [id number]      Request for Surgical Clearance    Procedure:   LEFT TOTAL KNEE ARTHROPLASTY  Date of Surgery:  Clearance TBD                                 Surgeon:  DR. DANIEL MARCHWIANY Surgeon's Group or Practice Name:  [facility] Phone number:  [phone] EXT [phone] ATTN: KELLY HIGH Fax number:  [phone]   Type of Clearance Requested:   - Medical  - Pharmacy:  Hold Apixaban (Eliquis)     Type of Anesthesia:  Spinal   Additional requests/questions:    Jiles Prows   08/07/2022, 5:22 PM

## 2022-08-08 NOTE — Telephone Encounter (Signed)
See recommendation by clinical pharmacist.  Again cardiac clearance can be addressed during office visit.

## 2022-08-08 NOTE — Telephone Encounter (Signed)
Clinical pharmacist to review Eliquis.  Patient is currently scheduled to see Dr. Agustin Cree in 2 weeks, cardiac clearance can be addressed by MD during the visit.

## 2022-08-08 NOTE — Telephone Encounter (Signed)
Patient with diagnosis of PAF on Eliquis for anticoagulation.    Procedure:   LEFT TOTAL KNEE ARTHROPLASTY  Date of procedure: TBD   CHA2DS2-VASc Score = 6  This indicates a 9.7% annual risk of stroke. The patient's score is based upon: CHF History: 1 HTN History: 1 Diabetes History: 1 Stroke History: 0 Vascular Disease History: 1 Age Score: 2 Gender Score: 0    CrCl 40 mL/min Platelet count 265K   Per office protocol, patient can hold Eliquis for 3 days prior to procedure.     **This guidance is not considered finalized until pre-operative APP has relayed final recommendations.**

## 2022-08-25 ENCOUNTER — Encounter: Payer: Self-pay | Admitting: Cardiology

## 2022-08-25 ENCOUNTER — Ambulatory Visit: Payer: Medicare Other | Attending: Cardiology | Admitting: Cardiology

## 2022-08-25 VITALS — BP 136/78 | HR 60 | Ht 70.0 in | Wt 196.8 lb

## 2022-08-25 DIAGNOSIS — I10 Essential (primary) hypertension: Secondary | ICD-10-CM | POA: Diagnosis not present

## 2022-08-25 DIAGNOSIS — I255 Ischemic cardiomyopathy: Secondary | ICD-10-CM

## 2022-08-25 DIAGNOSIS — I480 Paroxysmal atrial fibrillation: Secondary | ICD-10-CM | POA: Insufficient documentation

## 2022-08-25 DIAGNOSIS — E785 Hyperlipidemia, unspecified: Secondary | ICD-10-CM | POA: Insufficient documentation

## 2022-08-25 DIAGNOSIS — I2510 Atherosclerotic heart disease of native coronary artery without angina pectoris: Secondary | ICD-10-CM | POA: Diagnosis not present

## 2022-08-25 DIAGNOSIS — Z01810 Encounter for preprocedural cardiovascular examination: Secondary | ICD-10-CM | POA: Diagnosis present

## 2022-08-25 NOTE — Progress Notes (Unsigned)
Cardiology Office Note:    Date:  08/25/2022   ID:  Steven Dixon, DOB 08/17/1940, MRN [id number]  PCP:  Verdell Carmine., MD  Cardiologist:  Jenne Campus, MD    Referring MD: Verdell Carmine., MD   No chief complaint on file.   History of Present Illness:    Steven Dixon is a 82 y.o. male  ith past medical history significant for coronary artery disease. In 2009 he did have PTCA and stenting of the diagonal branch, also history of paroxysmal atrial fibrillation with CHA2DS2-VASc equals 3, anticoagulated, dyslipidemia, essential hypertension.  Comes today to my office for evaluation before elective left knee replacement surgery.  Cardiac wise doing well, denies have any chest pain tightness squeezing pressure burning chest but at the same time he cannot do exercise because of pain in the left knee he walk around with a cane.  Denies have any swelling of lower extremities no palpitations dizziness passing out.  Past Medical History:  Diagnosis Date   Arthritis    Atherosclerotic heart disease of native coronary artery without angina pectoris 06/21/2015   Overview:  Stent to LAD and diagonal 1 in September 2009  Formatting of this note might be different from the original. STEND IN LAD AND D1 IN 2009 Formatting of this note might be different from the original. 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 EF 50-55% on ECHO 05/11/2018 with apical hypokinesis and diastolic dysfunction.   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 03/15/2008   Pre-diabetes    Primary localized osteoarthritis of knee    Right   Primary localized osteoarthritis of right knee 08/14/2017   Type 2 diabetes mellitus ([place]) 01/23/2017   Overview:  Diet controlled   Wellness examination 05/13/2017    Past Surgical History:  Procedure Laterality Date   APPENDECTOMY     CARDIAC CATHETERIZATION     Stents X1   CATARACT EXTRACTION     CORONARY STENT PLACEMENT     EYE SURGERY     Bilateral cataracts   JOINT REPLACEMENT     KNEE CLOSED REDUCTION Right 10/14/2017   Procedure: CLOSED MANIPULATION RIGHT KNEE;  Surgeon: Earlie Server, MD;  Location: [facility];  Service: Orthopedics;  Laterality: Right;   KNEE SURGERY     ROTATOR CUFF REPAIR     TOTAL KNEE ARTHROPLASTY Right 08/14/2017   TOTAL KNEE ARTHROPLASTY Right 08/14/2017   Procedure: RIGHT TOTAL KNEE ARTHROPLASTY;  Surgeon: Earlie Server, MD;  Location: [facility];  Service: Orthopedics;  Laterality: Right;    Current Medications: Current Meds  Medication Sig   amLODipine (NORVASC) 5 MG tablet Take 5 mg by mouth daily.   apixaban (ELIQUIS) 5 MG TABS tablet Take 1 tablet (5 mg total) by mouth 2 (two) times daily.   atorvastatin (LIPITOR) 10 MG tablet  Take 1 tablet by mouth daily.   carvedilol (COREG) 3.125 MG tablet Take 1 tablet (3.125 mg total) by mouth 2 (two) times daily.   cholecalciferol (VITAMIN D) 1000 units tablet Take 1,000 Units by mouth daily.   ezetimibe (ZETIA) 10 MG tablet TAKE 1 TABLET BY MOUTH EVERY DAY   glipiZIDE (GLUCOTROL XL) 10 MG 24 hr tablet Take 10 mg by mouth daily.   JARDIANCE 10 MG TABS tablet Take 10 mg by mouth daily.   mupirocin ointment (BACTROBAN) 2 % Apply 1 application topically as needed (rash).   ramipril (ALTACE) 5 MG capsule Take 1 capsule (5 mg total) by mouth  daily.   tamsulosin (FLOMAX) 0.4 MG CAPS capsule Take 0.4 mg by mouth daily.   Zinc Sulfate (ZINC 15 PO) Take 1 tablet by mouth 3 (three) times a week.     Allergies:   Patient has no known allergies.   Social History   Socioeconomic History   Marital status: Married    Spouse name: Not on file   Number of children: Not on file   Years of education: Not on file   Highest education level: Not on file  Occupational History   Not on file  Tobacco Use   Smoking status: Former   Smokeless tobacco: Never  Vaping Use   Vaping Use: Never used  Substance and Sexual Activity   Alcohol use: No   Drug use: No   Sexual activity: Not on file  Other Topics Concern   Not on file  Social History Narrative   Not on file   Social Determinants of Health   Financial Resource Strain: Not on file  Food Insecurity: Not on file  Transportation Needs: Not on file  Physical Activity: Insufficiently Active (05/13/2017)   Exercise Vital Sign    Days of Exercise per Week: 3 days    Minutes of Exercise per Session: 30 min  Stress: No Stress Concern Present (05/13/2017)   Steven Dixon    Feeling of Stress : Not at all  Social Connections: Not on file     Family History: The patient's family history includes Heart disease in his father and mother; Rheum arthritis in his father. ROS:   Please see the history of present illness.    All 14 point review of systems negative except as described per history of present illness  EKGs/Labs/Other Studies Reviewed:      Recent Labs: 09/16/2021: BUN 19; Creatinine, Ser 1.46; Hemoglobin 14.2; Platelets 275; Potassium 4.2; Sodium 138  Recent Lipid Panel    Component Value Date/Time   CHOL 129 04/12/2020 0000   TRIG 196 (H) 04/12/2020 0000   HDL 46 04/12/2020 0000   CHOLHDL 2.8 04/12/2020 0000   LDLCALC 51 04/12/2020 0000    Physical Exam:    VS:  BP 136/78 (BP Location: Left Arm,  Patient Position: Sitting)   Pulse 60   Ht 5' 10"$  (1.778 m)   Wt 196 lb 12.8 oz (89.3 kg)   SpO2 99%   BMI 28.24 kg/m     Wt Readings from Last 3 Encounters:  08/25/22 196 lb 12.8 oz (89.3 kg)  02/19/22 187 lb (84.8 kg)  09/16/21 192 lb 9.6 oz (87.4 kg)     GEN:  Well nourished, well developed in no acute distress HEENT: Normal NECK: No JVD; No carotid bruits LYMPHATICS: No lymphadenopathy CARDIAC: RRR, no murmurs, no rubs, no gallops RESPIRATORY:  Clear to auscultation without rales,  wheezing or rhonchi  ABDOMEN: Soft, non-tender, non-distended MUSCULOSKELETAL:  No edema; No deformity  SKIN: Warm and dry LOWER EXTREMITIES: no swelling NEUROLOGIC:  Alert and oriented x 3 PSYCHIATRIC:  Normal affect   ASSESSMENT:    1. Ischemic cardiomyopathy   2. Atherosclerosis of native coronary artery of native heart without angina pectoris   3. Essential hypertension   4. Paroxysmal atrial fibrillation (HCC)   5. Dyslipidemia   6. Preop cardiovascular exam    PLAN:    In order of problems listed above:  History of ischemic cardiomyopathy, I will ask him to have a repeated echocardiogram to recheck left ventricle ejection fraction.  In the meantime we will continue present medications. Coronary artery disease status post PTCA and stenting of the diagonal branch in 2009.  I will schedule him to have a stress test make sure it is safe for him to proceed with surgery. Essential hypertension blood pressure seems to well-controlled continue present management Dyslipidemia.  I did review K PN which show me LDL 51 HDL 46 however this is from 2021.  Will call primary care physician to get more updated at least his medication and last fasting lipid profile.  In the meantime continue present medication which include moderate and less statin. Cardiovascular preop evaluation will schedule him to have echocardiogram as well as stress testing those test are fine should be no objections to proceed  from a cardiac standpoint review for surgery as scheduled.   Medication Adjustments/Labs and Tests Ordered: Current medicines are reviewed at length with the patient today.  Concerns regarding medicines are outlined above.  No orders of the defined types were placed in this encounter.  Medication changes: No orders of the defined types were placed in this encounter.   Signed, Park Liter, MD, [facility] 08/25/2022 9:18 AM    [place]

## 2022-08-25 NOTE — Patient Instructions (Signed)
Medication Instructions:  Your physician recommends that you continue on your current medications as directed. Please refer to the Current Medication list given to you today.  *If you need a refill on your cardiac medications before your next appointment, please call your pharmacy*   Lab Work: None Ordered If you have labs (blood work) drawn today and your tests are completely normal, you will receive your results only by: [place] (if you have MyChart) OR A paper copy in the mail If you have any lab test that is abnormal or we need to change your treatment, we will call you to review the results.   Testing/Procedures: Your physician has requested that you have a lexiscan myoview. For further information please visit [email or web address]. Please follow instruction sheet, as given.  The test will take approximately 3 to 4 hours to complete; you may bring reading material.  If someone comes with you to your appointment, they will need to remain in the main lobby due to limited space in the testing area.   How to prepare for your Myocardial Perfusion Test: Do not eat or drink 3 hours prior to your test, except you may have water. Do not consume products containing caffeine (regular or decaffeinated) 12 hours prior to your test. (ex: coffee, chocolate, sodas, tea). Do bring a list of your current medications with you.  If not listed below, you may take your medications as normal. Do wear comfortable clothes (no dresses or overalls) and walking shoes, tennis shoes preferred (No heels or open toe shoes are allowed). Do NOT wear cologne, perfume, aftershave, or lotions (deodorant is allowed). If these instructions are not followed, your test will have to be rescheduled.   Your physician has requested that you have an echocardiogram. Echocardiography is a painless test that uses sound waves to create images of your heart. It provides your doctor with information about the size and shape of  your heart and how well your heart's chambers and valves are working. This procedure takes approximately one hour. There are no restrictions for this procedure. Please do NOT wear cologne, perfume, aftershave, or lotions (deodorant is allowed). Please arrive 15 minutes prior to your appointment time.   Your physician has requested that you have an echocardiogram. Echocardiography is a painless test that uses sound waves to create images of your heart. It provides your doctor with information about the size and shape of your heart and how well your heart's chambers and valves are working. This procedure takes approximately one hour. There are no restrictions for this procedure. Please do NOT wear cologne, perfume, aftershave, or lotions (deodorant is allowed). Please arrive 15 minutes prior to your appointment time.    Follow-Up: At [facility], you and your health needs are our priority.  As part of our continuing mission to provide you with exceptional heart care, we have created designated Provider Care Teams.  These Care Teams include your primary Cardiologist (physician) and Advanced Practice Providers (APPs -  Physician Assistants and Nurse Practitioners) who all work together to provide you with the care you need, when you need it.  We recommend signing up for the patient portal called "MyChart".  Sign up information is provided on this After Visit Summary.  MyChart is used to connect with patients for Virtual Visits (Telemedicine).  Patients are able to view lab/test results, encounter notes, upcoming appointments, etc.  Non-urgent messages can be sent to your provider as well.   To learn more about what you can do  with MyChart, go to [email or web address].    Your next appointment:   6 month(s)  The format for your next appointment:   In Person  Provider:   Jenne Campus, MD    Other Instructions NA

## 2022-08-26 ENCOUNTER — Telehealth: Payer: Self-pay

## 2022-08-26 NOTE — Telephone Encounter (Signed)
Spoke with the patient, detailed instructions given. He stated that he would be here for his test. Asked to call back with any questions. S.Serjio Deupree EMTP/CCT

## 2022-08-27 ENCOUNTER — Ambulatory Visit: Payer: Medicare Other | Attending: Cardiology

## 2022-08-27 DIAGNOSIS — Z01810 Encounter for preprocedural cardiovascular examination: Secondary | ICD-10-CM | POA: Insufficient documentation

## 2022-08-27 LAB — MYOCARDIAL PERFUSION IMAGING
LV dias vol: 122 mL (ref 62–150)
LV sys vol: 63 mL
Nuc Stress EF: 48 %
Peak HR: 69 {beats}/min
Rest HR: 61 {beats}/min
Rest Nuclear Isotope Dose: 9.9 mCi
SDS: 2
SRS: 15
SSS: 17
Stress Nuclear Isotope Dose: 31.2 mCi
TID: 1.03

## 2022-08-27 MED ORDER — TECHNETIUM TC 99M TETROFOSMIN IV KIT
31.2000 | PACK | Freq: Once | INTRAVENOUS | Status: AC | PRN
  Administered 2022-08-27: 31.2 via INTRAVENOUS

## 2022-08-27 MED ORDER — REGADENOSON 0.4 MG/5ML IV SOLN
0.4000 mg | Freq: Once | INTRAVENOUS | Status: AC
  Administered 2022-08-27: 0.4 mg via INTRAVENOUS

## 2022-08-27 MED ORDER — TECHNETIUM TC 99M TETROFOSMIN IV KIT
9.9000 | PACK | Freq: Once | INTRAVENOUS | Status: AC | PRN
  Administered 2022-08-27: 9.9 via INTRAVENOUS

## 2022-09-04 ENCOUNTER — Ambulatory Visit: Payer: Medicare Other | Attending: Cardiology

## 2022-09-04 ENCOUNTER — Telehealth: Payer: Self-pay

## 2022-09-04 DIAGNOSIS — Z01810 Encounter for preprocedural cardiovascular examination: Secondary | ICD-10-CM | POA: Diagnosis not present

## 2022-09-04 LAB — ECHOCARDIOGRAM COMPLETE
Area-P 1/2: 2.9 cm2
P 1/2 time: 859 msec
S' Lateral: 4.5 cm

## 2022-09-04 NOTE — Telephone Encounter (Signed)
Pt viewed results in My Chart. Routed to PCP.

## 2022-09-08 ENCOUNTER — Telehealth: Payer: Self-pay

## 2022-09-08 DIAGNOSIS — Z79899 Other long term (current) drug therapy: Secondary | ICD-10-CM

## 2022-09-08 MED ORDER — ENTRESTO 24-26 MG PO TABS: ORAL_TABLET | ORAL | 2 refills | Status: DC

## 2022-09-08 NOTE — Telephone Encounter (Signed)
-----   Message from Park Liter, MD sent at 09/05/2022 10:25 AM EST ----- Echo showed mildly reduced ef, 45-50%, stop ramapril and 48h later start entresto 24/26 bid. Chem7 in 1 week

## 2022-09-08 NOTE — Telephone Encounter (Signed)
Patient notified of results and recommendation and agreed with plan. Medication sent to confirmed pharmacy. Lab order on file(aware of new location and hours at the [facility] location).

## 2022-09-22 ENCOUNTER — Other Ambulatory Visit: Payer: Self-pay

## 2023-02-23 DIAGNOSIS — M67471 Ganglion, right ankle and foot: Secondary | ICD-10-CM | POA: Insufficient documentation

## 2023-02-26 ENCOUNTER — Telehealth: Payer: Self-pay | Admitting: Cardiology

## 2023-02-26 NOTE — Telephone Encounter (Signed)
Callback team please contact requesting provider's office to inquire if Eliquis will need to be held for scheduled procedure.  Patient is currently on Eliquis 5 mg twice daily.  Thank you

## 2023-02-26 NOTE — Telephone Encounter (Signed)
   Patient Name: Steven Dixon  DOB: [birth year] MRN: [id number]  Primary Cardiologist: Gypsy Balsam, MD  Chart reviewed as part of pre-operative protocol coverage.   Please advise requesting provider that surgery should be delayed until October which will represent 6 months post PCI.  These are based on ACC/AHA guidelines unless risk of further delaying outweighs the risk of stent thrombosis.  Please let me know if have any further questions.   Napoleon Form, Leodis Rains, NP 02/26/2023, 12:30 PM

## 2023-02-26 NOTE — Telephone Encounter (Signed)
   Pre-operative Risk Assessment    Patient Name: Steven Dixon  DOB: 1941-02-05 MRN: [id number]      Request for Surgical Clearance    Procedure:   Excision Ganglion Cyst Right foot   Date of Surgery:  Clearance 03/04/23                                 Surgeon:  Dr. Flossie Dibble  Surgeon's Group or Practice Name:  [facility]   Phone number:  [phone] Fax number:  [phone]    Type of Clearance Requested:   - Medical    Type of Anesthesia:  MAC   Additional requests/questions:    SignedForest Gleason   02/26/2023, 10:20 AM

## 2023-02-26 NOTE — Telephone Encounter (Signed)
Spoke with Dr Devonne Doughty who states the patient does not need to hold the Eliquis to proceed. However, he wanted to make sure that patient did not need to wait six months before having any procedures. I advised him that I would send a message to the provider reviewing the chart and we would get back to him soon with clearance information. He voiced understanding.

## 2023-02-26 NOTE — Telephone Encounter (Signed)
Clearance note printed and faxed to requesting providers office.

## 2023-02-26 NOTE — Telephone Encounter (Signed)
Gaston Islam., NP  You9 minutes ago (12:17 PM)    The ACC/AHA guidelines recommend delaying elective noncardiac surgery for at least 6 months after drug-eluting stent (DES) placement, but surgery may be considered after 3 months if the risk of further delay outweighs the risk of stent thrombosis.

## 2023-02-26 NOTE — Telephone Encounter (Signed)
Ok! I guess you can put in pre-op note and we can send back to Dr Homero Fellers office.

## 2023-02-26 NOTE — Telephone Encounter (Addendum)
Left message with requesting office requesting if the Eliquis needs to be held.

## 2023-03-02 NOTE — Telephone Encounter (Signed)
Dr. Flossie Dibble office called asking for clearance to be faxed over. She states their clearance request had the incorrect fax number on it.   Fax: [phone]

## 2023-03-02 NOTE — Telephone Encounter (Signed)
I will re-fax notes to requesting office to see notes from pre op APP. Procedure will need to be delayed per recommendations.

## 2023-03-18 DIAGNOSIS — Z7185 Encounter for immunization safety counseling: Secondary | ICD-10-CM | POA: Insufficient documentation

## 2023-04-02 ENCOUNTER — Ambulatory Visit: Payer: Medicare Other | Attending: Cardiology | Admitting: Cardiology

## 2023-04-02 ENCOUNTER — Encounter: Payer: Self-pay | Admitting: Cardiology

## 2023-04-02 VITALS — BP 144/80 | HR 73 | Ht 70.0 in | Wt 188.8 lb

## 2023-04-02 DIAGNOSIS — E785 Hyperlipidemia, unspecified: Secondary | ICD-10-CM | POA: Insufficient documentation

## 2023-04-02 DIAGNOSIS — I10 Essential (primary) hypertension: Secondary | ICD-10-CM | POA: Diagnosis present

## 2023-04-02 DIAGNOSIS — I2510 Atherosclerotic heart disease of native coronary artery without angina pectoris: Secondary | ICD-10-CM | POA: Diagnosis present

## 2023-04-02 DIAGNOSIS — I255 Ischemic cardiomyopathy: Secondary | ICD-10-CM | POA: Insufficient documentation

## 2023-04-02 MED ORDER — ATORVASTATIN CALCIUM 20 MG PO TABS: 20.0000 mg | ORAL_TABLET | Freq: Every day | ORAL | 3 refills | Status: DC

## 2023-04-02 NOTE — Patient Instructions (Signed)
Medication Instructions:   START: Lipitor 20mg  1 tablet daily   Lab Work: Your physician recommends that you return for lab work in: 6 weeks You need to have labs done when you are fasting.  You can come Monday through Friday 8:30 am to 12:00 pm and 1:15 to 4:30. You do not need to make an appointment as the order has already been placed. The labs you are going to have done are Lipid, AST, ALT.    Testing/Procedures: None Ordered   Follow-Up: At [facility], you and your health needs are our priority.  As part of our continuing mission to provide you with exceptional heart care, we have created designated Provider Care Teams.  These Care Teams include your primary Cardiologist (physician) and Advanced Practice Providers (APPs -  Physician Assistants and Nurse Practitioners) who all work together to provide you with the care you need, when you need it.  We recommend signing up for the patient portal called "MyChart".  Sign up information is provided on this After Visit Summary.  MyChart is used to connect with patients for Virtual Visits (Telemedicine).  Patients are able to view lab/test results, encounter notes, upcoming appointments, etc.  Non-urgent messages can be sent to your provider as well.   To learn more about what you can do with MyChart, go to [email or web address].    Your next appointment:   4 month(s)  The format for your next appointment:   In Person  Provider:   Gypsy Balsam, MD    Other Instructions NA

## 2023-04-02 NOTE — Addendum Note (Signed)
Addended by: Baldo Ash D on: 04/02/2023 03:09 PM   Modules accepted: Orders

## 2023-04-02 NOTE — Progress Notes (Signed)
Cardiology Office Note:    Date:  04/02/2023   ID:  ADVAIT Dixon, DOB [birth year], MRN [id number]  PCP:  Raynelle Jan., MD  Cardiologist:  Gypsy Balsam, MD    Referring MD: Raynelle Jan., MD   Chief Complaint  Patient presents with   Medication Management    Atorvastatin    History of Present Illness:    Steven Dixon is a 82 y.o. male with past medical history significant for coronary artery disease.  In 2009 he did have PTCA and stenting done of the diagonal branch additional problem include paroxysmal atrial fibrillation he is anticoagulated, dyslipidemia, essential hypertension in February he came to my office to be evaluated before surgery.  That evaluation included echocardiogram as well as stress test stress test was negative.  However months later he started having some weakness fatigue tiredness and up going to [facility] hospital he was find to have 99% stenosis of circumflex artery.  Additional finding distal RCA PDA have 75% stenosis first diagonal branch was completely occluded.  Circumflex artery has being addressed with stenting.  Since that time he is doing well he denies have any chest pain tightness squeezing pressure burning chest he complained about number of medication he takes  Past Medical History:  Diagnosis Date   Arthritis    Atherosclerotic heart disease of native coronary artery without angina pectoris 06/21/2015   Overview:  Stent to LAD and diagonal 1 in September 2009  Formatting of this note might be different from the original. STEND IN LAD AND D1 IN 2009 Formatting of this note might be different from the original. 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 EF 50-55% on ECHO 05/11/2018 with apical hypokinesis and diastolic dysfunction.   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 03/15/2008   Pre-diabetes    Primary localized osteoarthritis of knee    Right   Primary localized osteoarthritis of right knee 08/14/2017   Type 2 diabetes mellitus (HCC) 01/23/2017   Overview:  Diet controlled   Wellness examination 05/13/2017    Past Surgical History:  Procedure Laterality Date   APPENDECTOMY     CARDIAC CATHETERIZATION     Stents X1   CATARACT EXTRACTION     CORONARY STENT PLACEMENT     EYE SURGERY     Bilateral cataracts   JOINT REPLACEMENT     KNEE CLOSED REDUCTION Right 10/14/2017   Procedure: CLOSED MANIPULATION RIGHT KNEE;  Surgeon: Frederico Hamman, MD;  Location: [facility];  Service: Orthopedics;  Laterality: Right;   KNEE SURGERY     ROTATOR CUFF REPAIR     TOTAL KNEE ARTHROPLASTY Right 08/14/2017   TOTAL KNEE ARTHROPLASTY Right 08/14/2017   Procedure: RIGHT TOTAL KNEE ARTHROPLASTY;  Surgeon: Frederico Hamman, MD;  Location: [facility];  Service: Orthopedics;  Laterality: Right;  Current Medications: Current Meds  Medication Sig   amLODipine (NORVASC) 5 MG tablet Take 5 mg by mouth daily.   apixaban (ELIQUIS) 5 MG TABS tablet Take 1 tablet (5 mg total) by mouth 2 (two) times daily.   atorvastatin (LIPITOR) 10 MG tablet Take 1 tablet by mouth daily.   carvedilol (COREG) 3.125 MG tablet Take 1 tablet (3.125 mg total) by mouth 2 (two) times daily.   cholecalciferol (VITAMIN D) 1000 units tablet Take 1,000 Units by mouth daily.   clopidogrel (PLAVIX) 75 MG tablet Take 75 mg  by mouth daily.   empagliflozin (JARDIANCE) 25 MG TABS tablet Take 25 mg by mouth daily.   ezetimibe (ZETIA) 10 MG tablet TAKE 1 TABLET BY MOUTH EVERY DAY (Patient taking differently: Take 10 mg by mouth daily.)   glipiZIDE (GLUCOTROL XL) 10 MG 24 hr tablet Take 10 mg by mouth daily.   tamsulosin (FLOMAX) 0.4 MG CAPS capsule Take 0.4 mg by mouth daily.   valsartan (DIOVAN) 80 MG tablet Take 80 mg by mouth daily.   [DISCONTINUED] mupirocin ointment (BACTROBAN) 2 % Apply 1 application topically as needed (rash).   [DISCONTINUED] sacubitril-valsartan (ENTRESTO) 24-26 MG 1 tablet by mouth twice daily. Must stop Ramapril for 48 hour before starting Entresto. (Patient taking differently: Take 1 tablet by mouth 2 (two) times daily. 1 tablet by mouth twice daily. Must stop Ramapril for 48 hour before starting Entresto.)   [DISCONTINUED] Zinc Sulfate (ZINC 15 PO) Take 1 tablet by mouth 3 (three) times a week.     Allergies:   Sacubitril-valsartan and Tape   Social History   Socioeconomic History   Marital status: Married    Spouse name: Not on file   Number of children: Not on file   Years of education: Not on file   Highest education level: Not on file  Occupational History   Not on file  Tobacco Use   Smoking status: Former   Smokeless tobacco: Never  Vaping Use   Vaping status: Never Used  Substance and Sexual Activity   Alcohol use: No   Drug use: No   Sexual activity: Not on file  Other Topics Concern   Not on file  Social History Narrative   Not on file   Social Determinants of Health   Financial Resource Strain: Medium Risk (01/24/2022)   [facility] prior to 09/06/2022., [facility]   Overall Financial Resource Strain (CARDIA)    Difficulty of Paying Living Expenses: Somewhat hard  Food Insecurity: Low Risk  (03/11/2023)   [facility]   Hunger  Vital Sign    Worried About Running Out of Food in the Last Year: Never true    Ran Out of Food in the Last Year: Never true  Transportation Needs: No Transportation Needs (03/11/2023)   Received from [employer]    In the past 12 months, has lack of reliable transportation kept you from medical appointments, meetings, work or from getting things needed for daily living? : No  Physical Activity: Insufficiently Active (01/24/2022)   [facility]   Exercise Vital Sign  Stress: No Stress Concern Present (01/24/2022)   Received from [facility]   [employer] of Occupational Health - Occupational Stress Questionnaire  Social Connections: Moderately Integrated (01/24/2022)   Received from [facility]   Social Connection and Isolation Panel [NHANES]  Family History: The patient's family history includes Heart disease in his father and mother; Rheum arthritis in his father. ROS:   Please see the history of present illness.    All 14 point review of systems negative except as described per history of present illness  EKGs/Labs/Other Studies Reviewed:         Recent Labs: No results found for requested labs within last 365 days.  Recent Lipid Panel    Component Value Date/Time   CHOL 129 04/12/2020 0000   TRIG 196 (H) 04/12/2020 0000   HDL 46 04/12/2020 0000   CHOLHDL 2.8 04/12/2020 0000   LDLCALC 51 04/12/2020 0000    Physical Exam:    VS:  BP (!) 144/80 (BP Location: Left Arm, Patient Position: Sitting)   Pulse 73   Ht 5\' 10"  (1.778 m)   Wt 188 lb 12.8 oz (85.6 kg)   SpO2 94%   BMI 27.09 kg/m     Wt Readings from Last 3 Encounters:  04/02/23 188 lb 12.8 oz (85.6 kg)  08/27/22 196 lb (88.9 kg)  08/25/22 196 lb 12.8 oz (89.3 kg)     GEN:  Well nourished, well developed in no acute distress HEENT: Normal NECK: No JVD; No carotid bruits LYMPHATICS: No lymphadenopathy CARDIAC: RRR, no murmurs, no rubs, no gallops RESPIRATORY:   Clear to auscultation without rales, wheezing or rhonchi  ABDOMEN: Soft, non-tender, non-distended MUSCULOSKELETAL:  No edema; No deformity  SKIN: Warm and dry LOWER EXTREMITIES: no swelling NEUROLOGIC:  Alert and oriented x 3 PSYCHIATRIC:  Normal affect   ASSESSMENT:    1. Atherosclerosis of native coronary artery of native heart without angina pectoris   2. Coronary artery disease involving native coronary artery of native heart without angina pectoris   3. Ischemic cardiomyopathy   4. Essential hypertension   5. Dyslipidemia    PLAN:    In order of problems listed above:  Coronary disease status post recent PTCA and stenting he is on dual therapy with Eliquis as well as Plavix will continue for at least 6 months.  Doing well asymptomatic.  Explained to him what potential mechanism of this phenomenon was I suspect he has developed abrupt narrowing of the circumflex artery.  He is asymptomatic right now. Cardiomyopathy he is on valsartan.  Was unable to tolerate Entresto, was on Coreg 3.125 twice daily.  I will continue. Essential hypertension blood pressure was slightly elevated we will continue monitoring. Dyslipidemia he was discharged for appropriately from hospital 80 of Lipitor however he slowly reduce to 10 he said he was feeling poorly on 80 mg.  I will increase to 20 today will check fasting lipid profile AST LT 6 weeks. We discussed multiple details today including reason for all medication he takes   Medication Adjustments/Labs and Tests Ordered: Current medicines are reviewed at length with the patient today.  Concerns regarding medicines are outlined above.  No orders of the defined types were placed in this encounter.  Medication changes: No orders of the defined types were placed in this encounter.   Signed, Georgeanna Lea, MD, [facility] 04/02/2023 2:57 PM    [facility]

## 2023-04-23 ENCOUNTER — Telehealth: Payer: Self-pay | Admitting: *Deleted

## 2023-04-23 NOTE — Telephone Encounter (Signed)
Patient with diagnosis of afib on Eliquis for anticoagulation.    Procedure: EXCISION GANGLION CYST RIGHT FOOT  Date of procedure: 04/29/23  CHA2DS2-VASc Score = 6  This indicates a 9.7% annual risk of stroke. The patient's score is based upon: CHF History: 1 HTN History: 1 Diabetes History: 1 Stroke History: 0 Vascular Disease History: 1 Age Score: 2 Gender Score: 0   CrCl 1mL/min Platelet count 251K  Per office protocol, patient can hold Eliquis for 1-2 days prior to procedure.    **This guidance is not considered finalized until pre-operative APP has relayed final recommendations.**

## 2023-04-23 NOTE — Telephone Encounter (Signed)
   Pre-operative Risk Assessment    Patient Name: Steven Dixon  DOB: 11-12-1940 MRN: [id number]  DATE OF LAST VISIT: 04/02/23 DR. KRASOWSKI DATE OF NEXT VISIT: NONE  ORIGINAL CLEARANCE PLACED 02/2023    Request for Surgical Clearance    Procedure:   EXCISION GANGLION CYST RIGHT FOOT  Date of Surgery:  Clearance 04/29/23                                 Surgeon:  DR. Flossie Dibble Surgeon's Group or Practice Name:  [facility] Phone number:  [phone] Fax number:  [phone]   Type of Clearance Requested:   - Medical  - Pharmacy:  Hold Apixaban (Eliquis)     Type of Anesthesia:  MAC   Additional requests/questions:    Elpidio Anis   04/23/2023, 12:32 PM

## 2023-04-24 NOTE — Telephone Encounter (Signed)
   Patient Name: Steven Dixon  DOB: 25-May-1941 MRN: [id number]  Primary Cardiologist: Gypsy Balsam, MD  Chart reviewed as part of pre-operative protocol coverage. Pre-op clearance already addressed by colleagues in earlier phone notes. To summarize recommendations:  -It is more than 6 months after non-STEMI so should be good to proceed  -Dr. Bing Matter  Per office protocol, patient can hold Eliquis for 1-2 days prior to procedure. Please resume when medically safe to do so.  Will route this bundled recommendation to requesting provider via Epic fax function and remove from pre-op pool. Please call with questions.  Sharlene Dory, PA-C 04/24/2023, 12:34 PM

## 2023-07-07 ENCOUNTER — Ambulatory Visit: Payer: Medicare Other | Attending: Cardiology | Admitting: Cardiology

## 2023-07-07 ENCOUNTER — Encounter: Payer: Self-pay | Admitting: Cardiology

## 2023-07-07 VITALS — BP 126/74 | HR 64 | Ht 70.0 in | Wt 195.0 lb

## 2023-07-07 DIAGNOSIS — I255 Ischemic cardiomyopathy: Secondary | ICD-10-CM

## 2023-07-07 DIAGNOSIS — I480 Paroxysmal atrial fibrillation: Secondary | ICD-10-CM | POA: Diagnosis not present

## 2023-07-07 DIAGNOSIS — I2510 Atherosclerotic heart disease of native coronary artery without angina pectoris: Secondary | ICD-10-CM | POA: Diagnosis not present

## 2023-07-07 DIAGNOSIS — I10 Essential (primary) hypertension: Secondary | ICD-10-CM

## 2023-07-07 NOTE — Patient Instructions (Signed)

## 2023-07-07 NOTE — Progress Notes (Signed)
 Cardiology Office Note:    Date:  07/07/2023   ID:  Steven Dixon, DOB [birth year], MRN [id number]  PCP:  Ryan Powell HERO., MD  Cardiologist:  Lamar Fitch, MD    Referring MD: Ryan Powell HERO., MD   No chief complaint on file.   History of Present Illness:    Steven Dixon is a 82 y.o. male past medical history significant for coronary disease in 2009 he did have PTCA and stenting done of the diagonal branch additional problem include paroxysmal atrial fibrillation he is anticoagulated, dyslipidemia, essential hypertension.  At the beginning of this year he end up going to [facility] hospital because of chest pain he was found to have 99% stent of the circumflex artery additional problem is distal RCA PDA 75% stenosis as well as completely occluded first diagonal branch.  Problem with circumflex artery has been addressed with stenting.  Since that time he is doing well.  Denies have any chest pain tightness squeezing pressure burning chest.  Problems with exercises because of his left knee problem.  Past Medical History:  Diagnosis Date   Arthritis    Atherosclerotic heart disease of native coronary artery without angina pectoris 06/21/2015   Overview:  Stent to LAD and diagonal 1 in September 2009  Formatting of this note might be different from the original. STEND IN LAD AND D1 IN 2009 Formatting of this note might be different from the original. 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 EF  50-55% on ECHO 05/11/2018 with apical hypokinesis and diastolic dysfunction.   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 03/15/2008   Pre-diabetes    Primary localized osteoarthritis of knee    Right   Primary localized osteoarthritis of right knee 08/14/2017   Type 2 diabetes mellitus (HCC) 01/23/2017   Overview:  Diet controlled   Wellness examination 05/13/2017    Past Surgical History:  Procedure Laterality Date   APPENDECTOMY     CARDIAC CATHETERIZATION     Stents X1   CATARACT EXTRACTION     CORONARY STENT PLACEMENT     EYE SURGERY     Bilateral cataracts   JOINT REPLACEMENT     KNEE CLOSED REDUCTION Right 10/14/2017   Procedure: CLOSED MANIPULATION RIGHT KNEE;  Surgeon: Shari Sieving, MD;  Location: [facility];  Service: Orthopedics;  Laterality: Right;   KNEE SURGERY     ROTATOR CUFF REPAIR     TOTAL KNEE ARTHROPLASTY Right 08/14/2017   TOTAL KNEE ARTHROPLASTY Right 08/14/2017   Procedure: RIGHT TOTAL KNEE ARTHROPLASTY;  Surgeon: Shari Sieving, MD;  Location: [facility];  Service: Orthopedics;  Laterality: Right;    Current Medications: Current Meds  Medication Sig   amLODipine (NORVASC) 5 MG tablet Take 5 mg by mouth daily.   apixaban  (ELIQUIS ) 5 MG TABS tablet Take  1 tablet (5 mg total) by mouth 2 (two) times daily.   carvedilol  (COREG ) 3.125 MG tablet Take 1 tablet (3.125 mg total) by mouth 2 (two) times daily.   cholecalciferol  (VITAMIN D ) 1000 units tablet Take 1,000 Units by mouth daily.   clopidogrel (PLAVIX) 75 MG tablet Take 75 mg by mouth daily.   empagliflozin (JARDIANCE) 25 MG TABS tablet Take 25 mg by mouth daily.   ezetimibe  (ZETIA ) 10 MG tablet TAKE 1 TABLET BY MOUTH EVERY DAY (Patient taking differently: Take 10 mg by mouth daily.)   glipiZIDE (GLUCOTROL XL) 10 MG 24 hr tablet Take 10 mg by mouth  daily.   mupirocin ointment (BACTROBAN) 2 %    SEMGLEE, YFGN, 100 UNIT/ML Pen Inject into the skin daily.   tamsulosin (FLOMAX) 0.4 MG CAPS capsule Take 0.4 mg by mouth daily.   valsartan (DIOVAN) 80 MG tablet Take 80 mg by mouth daily.     Allergies:   Sacubitril-valsartan and Tape   Social History   Socioeconomic History   Marital status: Married    Spouse name: Not on file   Number of children: Not on file   Years of education: Not on file   Highest education level: Not on file  Occupational History   Not on file  Tobacco Use   Smoking status: Former   Smokeless tobacco: Never  Vaping Use   Vaping status: Never Used  Substance and Sexual Activity   Alcohol use: No   Drug use: No   Sexual activity: Not on file  Other Topics Concern   Not on file  Social History Narrative   Not on file   Social Drivers of Health   Financial Resource Strain: Medium Risk (01/24/2022)   [facility] to 09/06/2022[facility], [facility] prior to 09/06/2022., [facility]   Overall Financial Resource Strain (CARDIA)    Difficulty of Paying Living Expenses: Somewhat hard  Food Insecurity: Low Risk  (03/11/2023)   Received from [facility]   Hunger Vital Sign    Worried About Running Out of Food in the Last Year: Never true    Ran Out of Food in the Last Year: Never true  Transportation Needs: No Transportation Needs (03/11/2023)   Received from [employer]    In the past 12 months, has lack of reliable transportation kept you from medical appointments, meetings, work or from getting things needed for daily living? : No  Physical Activity: Insufficiently Active (01/24/2022)   Received from [facility]   Exercise Vital Sign  Stress: No Stress Concern Present (01/24/2022)   Received from [facility]   [employer] of Occupational Health - Occupational Stress Questionnaire  Social  Connections: Moderately Integrated (01/24/2022)   Received from [facility]   Social Connection and Isolation Panel [NHANES]     Family History: The patient's family history includes Heart disease in his father and mother; Rheum arthritis in his father. ROS:   Please see the history of present illness.    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  Recent Lipid Panel    Component Value Date/Time   CHOL 129 04/12/2020 0000   TRIG 196 (H) 04/12/2020 0000   HDL 46 04/12/2020 0000   CHOLHDL 2.8 04/12/2020 0000   LDLCALC 51 04/12/2020 0000    Physical Exam:    VS:  BP 126/74   Pulse 64   Ht 5' 10 (1.778 m)   Wt 195 lb (88.5 kg)   SpO2 93%   BMI 27.98 kg/m     Wt Readings from Last 3 Encounters:  07/07/23 195 lb (88.5 kg)  04/02/23 188 lb 12.8 oz (85.6 kg)  08/27/22 196 lb (88.9 kg)     GEN:  Well nourished, well developed in no acute distress HEENT: Normal NECK: No JVD; No carotid bruits LYMPHATICS: No lymphadenopathy CARDIAC: RRR, no murmurs, no rubs, no gallops RESPIRATORY:  Clear to auscultation without rales, wheezing or rhonchi  ABDOMEN: Soft, non-tender, non-distended MUSCULOSKELETAL:  No edema; No deformity  SKIN: Warm and dry LOWER EXTREMITIES: no swelling NEUROLOGIC:  Alert and oriented x 3 PSYCHIATRIC:  Normal affect   ASSESSMENT:    1. Coronary artery disease involving native  coronary artery of native heart without angina pectoris   2. Ischemic cardiomyopathy   3. Essential hypertension   4. Paroxysmal atrial fibrillation (HCC)    PLAN:    In order of problems listed above:  Coronary disease stable from that point with doing well.  It is time to discontinue his Plavix will continue with Eliquis . Ischemic cardiomyopathy he had difficulty tolerating medication doing very well in the future we will make arrangements for echocardiogram. Essential hypertension blood pressure well-controlled. Dyslipidemia I did review K PN which show me his HDL of 46 LDL 51 we will continue present management   Medication Adjustments/Labs and Tests Ordered: Current medicines are reviewed at length with the patient today.  Concerns regarding medicines are outlined above.  Orders Placed This Encounter  Procedures   EKG 12-Lead   Medication changes: No orders of the defined types were placed in this encounter.   Signed, [place] DOROTHA Bernie, MD, [facility] 07/07/2023 2:10 PM    [facility]

## 2023-07-21 DIAGNOSIS — L309 Dermatitis, unspecified: Secondary | ICD-10-CM | POA: Insufficient documentation

## 2023-10-20 DIAGNOSIS — J301 Allergic rhinitis due to pollen: Secondary | ICD-10-CM | POA: Insufficient documentation

## 2023-12-08 ENCOUNTER — Ambulatory Visit: Attending: Cardiology | Admitting: Cardiology

## 2023-12-08 VITALS — BP 140/68 | HR 58 | Ht 70.0 in | Wt 196.8 lb

## 2023-12-08 DIAGNOSIS — I255 Ischemic cardiomyopathy: Secondary | ICD-10-CM

## 2023-12-08 DIAGNOSIS — I2510 Atherosclerotic heart disease of native coronary artery without angina pectoris: Secondary | ICD-10-CM | POA: Diagnosis present

## 2023-12-08 DIAGNOSIS — R0609 Other forms of dyspnea: Secondary | ICD-10-CM

## 2023-12-08 DIAGNOSIS — E785 Hyperlipidemia, unspecified: Secondary | ICD-10-CM

## 2023-12-08 DIAGNOSIS — I480 Paroxysmal atrial fibrillation: Secondary | ICD-10-CM | POA: Diagnosis present

## 2023-12-08 MED ORDER — ROSUVASTATIN CALCIUM 10 MG PO TABS: 10.0000 mg | ORAL_TABLET | Freq: Every day | ORAL | 3 refills | Status: DC

## 2023-12-08 NOTE — Progress Notes (Signed)
 Cardiology Office Note:    Date:  12/08/2023   ID:  CHEYNE BOULDEN, DOB 11-11-1940, MRN [id number]  PCP:  Liza Riggers., MD  Cardiologist:  Ralene Burger, MD    Referring MD: Liza Riggers., MD   Chief Complaint  Patient presents with   Follow-up    History of Present Illness:    Steven Dixon is a 83 y.o. male  past medical history significant for coronary disease in 2009 he did have PTCA and stenting done of the diagonal branch additional problem include paroxysmal atrial fibrillation he is anticoagulated, dyslipidemia, essential hypertension. At the beginning of this year he end up going to [facility] because of chest pain he was found to have 99% stent of the circumflex artery additional problem is distal RCA PDA 75% stenosis as well as completely occluded first diagonal branch. Problem with circumflex artery has been addressed with stenting. Since that time he is doing well.  Comes today to my office for follow-up overall he is doing well.  Denies have any chest pain tightness squeezing pressure burning chest.  Joking he tells me that he is getting altered but overall still active  Past Medical History:  Diagnosis Date   Arthritis    Atherosclerotic heart disease of native coronary artery without angina pectoris 06/21/2015   Overview:  Stent to LAD and diagonal 1 in September 2009  Formatting of this note might be different from the original. STEND IN LAD AND D1 IN 2009 Formatting of this note might be different from the original. Stent to LAD and diagonal 1 in September 2009   Benign prostatic hyperplasia 02/16/2020   Bradycardia 01/12/2019   Cardiomyopathy (HCC)    CKD (chronic kidney disease), stage III (HCC) 02/06/2016   Coronary artery disease    has 2 stents in [facility]   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 EF 50-55% on ECHO 05/11/2018 with apical hypokinesis and diastolic dysfunction.   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 03/15/2008   Pre-diabetes    Primary localized osteoarthritis of knee    Right   Primary localized osteoarthritis of right knee 08/14/2017   Type 2 diabetes mellitus (HCC) 01/23/2017   Overview:  Diet controlled   Wellness examination 05/13/2017    Past Surgical History:  Procedure Laterality Date   APPENDECTOMY     CARDIAC CATHETERIZATION     Stents X1   CATARACT EXTRACTION     CORONARY STENT PLACEMENT     EYE SURGERY     Bilateral cataracts   JOINT REPLACEMENT     KNEE CLOSED REDUCTION Right 10/14/2017   Procedure: CLOSED MANIPULATION RIGHT KNEE;  Surgeon: Marlena Sima, MD;  Location: [facility];  Service: Orthopedics;  Laterality: Right;   KNEE SURGERY     ROTATOR CUFF REPAIR     TOTAL KNEE ARTHROPLASTY Right 08/14/2017   TOTAL KNEE ARTHROPLASTY Right 08/14/2017   Procedure: RIGHT TOTAL KNEE ARTHROPLASTY;  Surgeon: Marlena Sima, MD;  Location: [facility] OR;  Service: Orthopedics;  Laterality: Right;    Current Medications: Current Meds  Medication Sig  amLODipine (NORVASC) 5 MG tablet Take 5 mg by mouth daily.   apixaban  (ELIQUIS ) 5 MG TABS tablet Take 1 tablet (5 mg total) by mouth 2 (two) times daily.   carvedilol  (COREG ) 3.125 MG tablet Take 1 tablet (3.125 mg total) by mouth 2 (two) times daily.   cholecalciferol  (VITAMIN D ) 1000 units tablet Take 1,000 Units by mouth daily.   clopidogrel (PLAVIX) 75 MG tablet Take 75 mg by mouth daily.   empagliflozin (JARDIANCE) 25 MG TABS tablet Take 25 mg by mouth daily.   ezetimibe  (ZETIA ) 10 MG tablet TAKE 1 TABLET BY MOUTH EVERY DAY (Patient taking differently:  Take 10 mg by mouth daily.)   glipiZIDE (GLUCOTROL XL) 10 MG 24 hr tablet Take 10 mg by mouth daily.   mupirocin ointment (BACTROBAN) 2 % Apply 1 Application topically daily.   rosuvastatin (CRESTOR) 5 MG tablet Take 5 mg by mouth daily.   SEMGLEE, YFGN, 100 UNIT/ML Pen Inject into the skin daily.   tamsulosin (FLOMAX) 0.4 MG CAPS capsule Take 0.4 mg by mouth daily.   valsartan (DIOVAN) 80 MG tablet Take 80 mg by mouth daily.   [DISCONTINUED] atorvastatin  (LIPITOR) 20 MG tablet Take 1 tablet (20 mg total) by mouth daily.     Allergies:   Atorvastatin , Sacubitril-valsartan, and Tape   Social History   Socioeconomic History   Marital status: Married    Spouse name: Not on file   Number of children: Not on file   Years of education: Not on file   Highest education level: Not on file  Occupational History   Not on file  Tobacco Use   Smoking status: Former   Smokeless tobacco: Never  Vaping Use   Vaping status: Never Used  Substance and Sexual Activity   Alcohol use: No   Drug use: No   Sexual activity: Not on file  Other Topics Concern   Not on file  Social History Narrative   Not on file   Social Drivers of Health   Financial Resource Strain: Medium Risk (01/24/2022)   [facility] to [facility] prior to 09/06/2022., [facility]   Overall Financial Resource Strain (CARDIA)    Difficulty of Paying Living Expenses: Somewhat hard  Food Insecurity: Low Risk  (10/20/2023)   [facility]   Hunger Vital Sign    Worried About Running Out of Food in the Last Year: Never true    Ran Out of Food in the Last Year: Never true  Transportation Needs: No Transportation Needs (10/20/2023)   Received from [facility]    In the past 12 months, has lack of reliable transportation kept you from medical appointments, meetings, work or from getting things  needed for daily living? : No  Physical Activity: Insufficiently Active (01/24/2022)   [facility]   Exercise Vital Sign  Stress: No Stress Concern Present (01/24/2022)   Received from [facility]   [employer] of Occupational Health - Occupational Stress Questionnaire  Social Connections: Moderately Integrated (01/24/2022)   Received from [facility]   Social Connection and Isolation Panel [NHANES]     Family History: The patient's family history includes Heart disease in his father and mother; Rheum arthritis in his father. ROS:   Please see the history of present illness.    All 14 point review of systems negative except as described per history of present illness  EKGs/Labs/Other Studies Reviewed:         Recent Labs: No results found for requested labs within last 365 days.  Recent Lipid Panel    Component Value Date/Time   CHOL 129 04/12/2020 0000   TRIG 196 (H) 04/12/2020 0000   HDL 46 04/12/2020 0000   CHOLHDL 2.8 04/12/2020 0000   LDLCALC 51 04/12/2020 0000    Physical Exam:    VS:  BP (!) 140/68 (BP Location: Right Arm, Patient Position: Sitting)   Pulse (!) 58   Ht 5\' 10"  (1.778 m)   Wt 196 lb 12.8 oz (89.3 kg)   SpO2 93%   BMI 28.24 kg/m     Wt Readings from Last 3 Encounters:  12/08/23 196 lb 12.8 oz (89.3 kg)  07/07/23 195 lb (88.5 kg)  04/02/23 188 lb 12.8 oz (85.6 kg)     GEN:  Well nourished, well developed in no acute distress HEENT: Normal NECK: No JVD; No carotid bruits LYMPHATICS: No lymphadenopathy CARDIAC: RRR, no murmurs, no rubs, no gallops RESPIRATORY:  Clear to auscultation without rales, wheezing or rhonchi  ABDOMEN: Soft, non-tender, non-distended MUSCULOSKELETAL:  No edema; No deformity  SKIN: Warm and dry LOWER EXTREMITIES: no swelling NEUROLOGIC:  Alert and oriented x 3 PSYCHIATRIC:  Normal affect   ASSESSMENT:    1. Coronary artery disease involving native coronary artery of native heart without  angina pectoris   2. Ischemic cardiomyopathy   3. Paroxysmal atrial fibrillation (HCC)   4. Dyslipidemia    PLAN:    In order of problems listed above:  Coronary disease stable from that point review on guideline directed medical therapy which I will continue. Ischemic cardiomyopathy we have difficulty putting him on guideline directed medical therapy, we will recheck his echocardiogram to recheck left ventricle ejection fraction to see if we need to intensify his therapy. Paroxysmal atrial fibrillation, denies having any palpitations Dyslipidemia I did review his blood test done by primary care physician LDL is 110 HDL 46 clearly unacceptably high.  Will double the dose of Crestor to 10 mg daily, fasting lipid profile, AST LT 6 weeks   Medication Adjustments/Labs and Tests Ordered: Current medicines are reviewed at length with the patient today.  Concerns regarding medicines are outlined above.  No orders of the defined types were placed in this encounter.  Medication changes: No orders of the defined types were placed in this encounter.   Signed, Manfred Seed, MD, [facility] 12/08/2023 10:58 AM    [facility]

## 2023-12-08 NOTE — Patient Instructions (Signed)
 Medication Instructions:   INCREASE: Crestor to 10mg  1 tablet daily   Lab Work: Your physician recommends that you return for lab work in: 6 weeks You need to have labs done when you are fasting.  You can come Monday through Friday 8:30 am to 12:00 pm and 1:15 to 4:30. You do not need to make an appointment as the order has already been placed. The labs you are going to have done are AST, ALT Lipids.    Testing/Procedures: Your physician has requested that you have an echocardiogram. Echocardiography is a painless test that uses sound waves to create images of your heart. It provides your doctor with information about the size and shape of your heart and how well your heart's chambers and valves are working. This procedure takes approximately one hour. There are no restrictions for this procedure. Please do NOT wear cologne, perfume, aftershave, or lotions (deodorant is allowed). Please arrive 15 minutes prior to your appointment time.  Please note: We ask at that you not bring children with you during ultrasound (echo/ vascular) testing. Due to room size and safety concerns, children are not allowed in the ultrasound rooms during exams. Our front office staff cannot provide observation of children in our lobby area while testing is being conducted. An adult accompanying a patient to their appointment will only be allowed in the ultrasound room at the discretion of the ultrasound technician under special circumstances. We apologize for any inconvenience.    Follow-Up: At [facility], you and your health needs are our priority.  As part of our continuing mission to provide you with exceptional heart care, we have created designated Provider Care Teams.  These Care Teams include your primary Cardiologist (physician) and Advanced Practice Providers (APPs -  Physician Assistants and Nurse Practitioners) who all work together to provide you with the care you need, when you need it.  We recommend  signing up for the patient portal called "MyChart".  Sign up information is provided on this After Visit Summary.  MyChart is used to connect with patients for Virtual Visits (Telemedicine).  Patients are able to view lab/test results, encounter notes, upcoming appointments, etc.  Non-urgent messages can be sent to your provider as well.   To learn more about what you can do with MyChart, go to [email or web address].    Your next appointment:   6 month(s)  The format for your next appointment:   In Person  Provider:   Ralene Burger, MD    Other Instructions NA

## 2023-12-08 NOTE — Addendum Note (Signed)
 Addended by: Shawnee Dellen D on: 12/08/2023 11:14 AM   Modules accepted: Orders

## 2023-12-29 ENCOUNTER — Telehealth: Payer: Self-pay | Admitting: Cardiology

## 2023-12-29 NOTE — Telephone Encounter (Signed)
 Pt c/o medication issue:  1. Name of Medication: Rosuvastatin   2. How are you currently taking this medication (dosage and times per day)?   3. Are you having a reaction (difficulty breathing--STAT)?   4. What is your medication issue? Patient wants to change from Rosuvastatin  back to Atorvastatin . He is having a lot of muscle pain and stiffness in his knees

## 2024-01-05 MED ORDER — ATORVASTATIN CALCIUM 10 MG PO TABS: 10.0000 mg | ORAL_TABLET | Freq: Every day | ORAL | Status: DC

## 2024-01-05 NOTE — Telephone Encounter (Signed)
 Spoke with pt he stated that he will switch from Rosuvastatin  to Atorvastatin .

## 2024-01-06 ENCOUNTER — Telehealth: Payer: Self-pay | Admitting: Cardiology

## 2024-01-06 NOTE — Telephone Encounter (Signed)
 Called patient and sent him and email to email address on file (verified by patient) for new activation code/ pt will call if he has any issues

## 2024-01-06 NOTE — Telephone Encounter (Signed)
-----   Message from Nurse Olam DEL sent at 01/05/2024  4:37 PM EDT ----- Steven Dixon says he is locked out of My Chart  Are you able to assist him? TY

## 2024-01-12 ENCOUNTER — Other Ambulatory Visit

## 2024-01-27 ENCOUNTER — Ambulatory Visit: Attending: Cardiology

## 2024-01-27 DIAGNOSIS — R0609 Other forms of dyspnea: Secondary | ICD-10-CM | POA: Insufficient documentation

## 2024-01-27 LAB — ECHOCARDIOGRAM COMPLETE
Area-P 1/2: 2.48 cm2
P 1/2 time: 873 ms
S' Lateral: 3.6 cm

## 2024-01-28 ENCOUNTER — Ambulatory Visit: Payer: Self-pay | Admitting: Cardiology

## 2024-01-28 LAB — LIPID PANEL
Chol/HDL Ratio: 2.9 ratio (ref 0.0–5.0)
Cholesterol, Total: 122 mg/dL (ref 100–199)
HDL: 42 mg/dL (ref >39)
LDL Chol Calc (NIH): 53 mg/dL (ref 0–99)
Triglycerides: 158 mg/dL — ABNORMAL HIGH (ref 0–149)
VLDL Cholesterol Cal: 27 mg/dL (ref 5–40)

## 2024-01-28 LAB — AST: AST: 16 IU/L (ref 0–40)

## 2024-01-28 LAB — ALT: ALT: 19 IU/L (ref 0–44)

## 2024-02-02 ENCOUNTER — Telehealth: Payer: Self-pay

## 2024-02-02 NOTE — Telephone Encounter (Signed)
 Echo Results reviewed with pt as per Dr. Vanetta Shawl note.  Pt verbalized understanding and had no additional questions. Routed to PCP

## 2024-02-02 NOTE — Telephone Encounter (Signed)
 Lab & Echo Results reviewed with pt as per Dr. Karry note.  Pt verbalized understanding and had no additional questions. Routed to PCP

## 2024-04-13 ENCOUNTER — Other Ambulatory Visit: Payer: Self-pay | Admitting: Cardiology

## 2024-04-13 NOTE — Telephone Encounter (Signed)
 Pt c/o medication issue:  1. Name of Medication:   atorvastatin  (LIPITOR) 10 MG tablet    2. How are you currently taking this medication (dosage and times per day)? As written   3. Are you having a reaction (difficulty breathing--STAT)? No   4. What is your medication issue? Pt called in stating he has been taking 20mg  daily of this medication. He asked if this can be changed and sent to pharmacy.    [facility] - [place], [place] - [address] Phone: [phone]  Fax: [phone]

## 2024-04-13 NOTE — Telephone Encounter (Signed)
 Pt stating that he has been taking atorvastatin  20 mg tablets. This is not on pt's medication list. Please address

## 2024-04-15 MED ORDER — ATORVASTATIN CALCIUM 20 MG PO TABS: 20.0000 mg | ORAL_TABLET | Freq: Every day | ORAL | 3 refills | Status: AC

## 2024-05-13 ENCOUNTER — Telehealth: Payer: Self-pay | Admitting: *Deleted

## 2024-05-13 NOTE — Telephone Encounter (Signed)
 Please advise holding Eliquis  prior to left total knee arthoplasty not yet scheduled.  Last labs October 2025.   Thank you!  DW

## 2024-05-13 NOTE — Telephone Encounter (Signed)
   Pre-operative Risk Assessment    Patient Name: Steven Dixon  DOB: [birth year] MRN: [id number]      Request for Surgical Clearance    Procedure:  Left Total Knee Arthroplasty  Date of Surgery:  Clearance TBD                                 Surgeon:  Dr. Toribio Higashi Surgeon's Group or Practice Name:  [facility] Phone number:  [phone] Fax number:  [phone]   Type of Clearance Requested:   - Medical  - Pharmacy:  Hold Apixaban  (Eliquis ) Directions   Type of Anesthesia:  Spinal   Additional requests/questions:    SignedArloa Donovan Dines   05/13/2024, 4:16 PM

## 2024-05-17 ENCOUNTER — Telehealth: Payer: Self-pay

## 2024-05-17 DIAGNOSIS — I480 Paroxysmal atrial fibrillation: Secondary | ICD-10-CM

## 2024-05-17 MED ORDER — APIXABAN 2.5 MG PO TABS: 2.5000 mg | ORAL_TABLET | Freq: Two times a day (BID) | ORAL | 4 refills | Status: AC

## 2024-05-17 NOTE — Telephone Encounter (Signed)
 Unable to reach patient directly. Spoke with patient's daughter, Steven Dixon, regarding adjustment to patient's Eliquis  dosage due to age and renal function. Discontinue Eliquis  5 mg twice daily and initiate Eliquis  2.5 mg twice daily. She will relay this information to the patient. Steven Dixon also confirmed the preferred pharmacy, and the prescription has been sent.

## 2024-05-17 NOTE — Telephone Encounter (Signed)
 Patient with diagnosis of atrial fibrillation on Eliquis  for anticoagulation.    Procedure:  Left Total Knee Arthroplasty   Date of Surgery:  Clearance TBD                  CHA2DS2-VASc Score = 5   This indicates a 7.2% annual risk of stroke. The patient's score is based upon: CHF History: 0 HTN History: 1 Diabetes History: 1 Stroke History: 0 Vascular Disease History: 1 Age Score: 2 Gender Score: 0    CrCl 34 ml/min Platelet count 285 K  Patient has not had an Afib/aflutter ablation in the last 3 months, DCCV within the last 4 weeks or a watchman implanted in the last 45 days   Per office protocol, patient can hold Eliquis  for 3 days prior to procedure.    Patient will not need bridging with Lovenox (enoxaparin) around procedure.  **This guidance is not considered finalized until pre-operative APP has relayed final recommendations.**

## 2024-05-18 ENCOUNTER — Telehealth: Payer: Self-pay

## 2024-05-18 NOTE — Telephone Encounter (Signed)
 Patient has been scheduled for televisit and consent done     Patient Consent for Virtual Visit         Steven Dixon has provided verbal consent on 05/18/2024 for a virtual visit (video or telephone).   CONSENT FOR VIRTUAL VISIT FOR:  Steven Dixon  By participating in this virtual visit I agree to the following:  I hereby voluntarily request, consent and authorize [facility] and its employed or contracted physicians, physician assistants, nurse practitioners or other licensed health care professionals (the Practitioner), to provide me with telemedicine health care services (the "Services) as deemed necessary by the treating Practitioner. I acknowledge and consent to receive the Services by the Practitioner via telemedicine. I understand that the telemedicine visit will involve communicating with the Practitioner through live audiovisual communication technology and the disclosure of certain medical information by electronic transmission. I acknowledge that I have been given the opportunity to request an in-person assessment or other available alternative prior to the telemedicine visit and am voluntarily participating in the telemedicine visit.  I understand that I have the right to withhold or withdraw my consent to the use of telemedicine in the course of my care at any time, without affecting my right to future care or treatment, and that the Practitioner or I may terminate the telemedicine visit at any time. I understand that I have the right to inspect all information obtained and/or recorded in the course of the telemedicine visit and may receive copies of available information for a reasonable fee.  I understand that some of the potential risks of receiving the Services via telemedicine include:  Delay or interruption in medical evaluation due to technological equipment failure or disruption; Information transmitted may not be sufficient (e.g. poor resolution of images) to  allow for appropriate medical decision making by the Practitioner; and/or  In rare instances, security protocols could fail, causing a breach of personal health information.  Furthermore, I acknowledge that it is my responsibility to provide information about my medical history, conditions and care that is complete and accurate to the best of my ability. I acknowledge that Practitioner's advice, recommendations, and/or decision may be based on factors not within their control, such as incomplete or inaccurate data provided by me or distortions of diagnostic images or specimens that may result from electronic transmissions. I understand that the practice of medicine is not an exact science and that Practitioner makes no warranties or guarantees regarding treatment outcomes. I acknowledge that a copy of this consent can be made available to me via my patient portal [facility] MyChart), or I can request a printed copy by calling the office of [facility].    I understand that my insurance will be billed for this visit.   I have read or had this consent read to me. I understand the contents of this consent, which adequately explains the benefits and risks of the Services being provided via telemedicine.  I have been provided ample opportunity to ask questions regarding this consent and the Services and have had my questions answered to my satisfaction. I give my informed consent for the services to be provided through the use of telemedicine in my medical care

## 2024-05-18 NOTE — Telephone Encounter (Signed)
   Name: Steven Dixon  DOB: [birth year]  MRN: [id number]  Primary Cardiologist: Lamar Fitch, MD   Preoperative team, please contact this patient and set up a phone call appointment for further preoperative risk assessment. Please obtain consent and complete medication review. Thank you for your help.  I confirm that guidance regarding antiplatelet and oral anticoagulation therapy has been completed and, if necessary, noted below. Pharm team also launched separate phone note updating Eliquis  dose per chart review.  I also confirmed the patient resides in the state of  . As per [facility] Medical Board telemedicine laws, the patient must reside in the state in which the provider is licensed.   Raphael LOISE Bring, PA-C 05/18/2024, 4:32 PM [facility]

## 2024-05-18 NOTE — Telephone Encounter (Signed)
Patient has been scheduled for televisit.

## 2024-05-19 NOTE — Telephone Encounter (Signed)
 I was contacted today through secure chat by Steven Dixon surgery scheduler at [facility]: This patient is pending a knee replacement. He has a TH visit 07/21/24. Is there a way to get that any sooner as he is wanting to get on the schedule;  he would like asap.    Me: my 1st tele is not until 11/18  Me: I called the pt and left a message to call back ASAP as the schedules are filling up very quickly as it that time of the year.    Steven Dixon and I rescheduled the pt sooner tele preop appt 05/24/24 11 am, she said she will call him and let him know of the new time and date and if he needs to change the new ap[pt he will need to call back.

## 2024-05-23 NOTE — Progress Notes (Unsigned)
 Virtual Visit via Telephone Note   Because of Steven Dixon co-morbid illnesses, he is at least at moderate risk for complications without adequate follow up.  This format is felt to be most appropriate for this patient at this time.  Due to technical limitations with video connection (technology), today's appointment will be conducted as an audio only telehealth visit, and Steven Dixon verbally agreed to proceed in this manner.   All issues noted in this document were discussed and addressed.  No physical exam could be performed with this format.  Evaluation Performed:  Preoperative cardiovascular risk assessment _____________   Date:  05/23/2024   Patient ID:  Steven Dixon, DOB 13-Jul-1940, MRN [id number] Patient Location:  Home Provider location:   Office  Primary Care Provider:  Spry, Heather M., MD Primary Cardiologist:  Lamar Fitch, MD  Chief Complaint / Patient Profile   83 y.o. y/o male with a h/o cardiomyopathy, coronary artery disease, HTN, HLD who is pending left total knee arthroplasty and presents today for telephonic preoperative cardiovascular risk assessment.  History of Present Illness    Steven Dixon is a 83 y.o. male who presents via audio/video conferencing for a telehealth visit today.  Pt was last seen in cardiology clinic on 12/08/2023 by Dr. Fitch.  At that time Steven Dixon was doing well .  The patient is now pending procedure as outlined above. Since his last visit, he continues to be stable from a cardiac standpoint.  Today he denies chest pain, shortness of breath, lower extremity edema, fatigue, palpitations, melena, hematuria, hemoptysis, diaphoresis, weakness, presyncope, syncope, orthopnea, and PND.   Past Medical History    Past Medical History:  Diagnosis Date   Arthritis    Atherosclerotic heart disease of native coronary artery without angina pectoris 06/21/2015   Overview:  Stent to LAD and diagonal 1 in September 2009   Formatting of this note might be different from the original. STEND IN LAD AND D1 IN 2009 Formatting of this note might be different from the original. 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 EF 50-55% on ECHO 05/11/2018 with apical hypokinesis and diastolic dysfunction.   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 03/15/2008   Pre-diabetes    Primary localized osteoarthritis of knee    Right   Primary localized osteoarthritis of right knee 08/14/2017   Type 2 diabetes mellitus (HCC) 01/23/2017   Overview:  Diet controlled   Wellness examination 05/13/2017   Past Surgical History:  Procedure Laterality Date   APPENDECTOMY     CARDIAC CATHETERIZATION     Stents X1   CATARACT EXTRACTION     CORONARY STENT PLACEMENT     EYE SURGERY     Bilateral cataracts   JOINT REPLACEMENT     KNEE CLOSED REDUCTION Right 10/14/2017   Procedure:  CLOSED MANIPULATION RIGHT KNEE;  Surgeon: Shari Sieving, MD;  Location: [facility];  Service: Orthopedics;  Laterality: Right;   KNEE SURGERY     ROTATOR CUFF REPAIR     TOTAL KNEE ARTHROPLASTY Right 08/14/2017   TOTAL KNEE ARTHROPLASTY Right 08/14/2017   Procedure: RIGHT TOTAL KNEE ARTHROPLASTY;  Surgeon: Shari Sieving, MD;  Location: [facility] OR;  Service: Orthopedics;  Laterality: Right;    Allergies  Allergies  Allergen  Reactions   Atorvastatin  Other (See Comments)   Sacubitril-Valsartan Other (See Comments)    Insomnia   Tape Dermatitis    Home Medications    Prior to Admission medications   Medication Sig Start Date End Date Taking? Authorizing Provider  amLODipine (NORVASC) 5 MG tablet Take 5 mg by mouth daily. 01/31/22   [provider]  apixaban  (ELIQUIS ) 2.5 MG TABS tablet Take 1 tablet (2.5 mg total) by mouth 2 (two) times daily. 05/17/24   Krasowski, Robert J, MD  atorvastatin  (LIPITOR) 20 MG tablet Take 1 tablet (20 mg total) by mouth daily. 04/15/24   Krasowski, Robert J, MD  carvedilol  (COREG ) 3.125 MG tablet Take 1 tablet (3.125 mg total) by mouth 2 (two) times daily. 04/12/20   Krasowski, Robert J, MD  cholecalciferol  (VITAMIN D ) 1000 units tablet Take 1,000 Units by mouth daily.    [provider]  clopidogrel (PLAVIX) 75 MG tablet Take 75 mg by mouth daily.    [provider]  empagliflozin (JARDIANCE) 25 MG TABS tablet Take 25 mg by mouth daily. 01/16/22   [provider]  ezetimibe  (ZETIA ) 10 MG tablet TAKE 1 TABLET BY MOUTH EVERY DAY Patient taking differently: Take 10 mg by mouth daily. 06/05/20   Krasowski, Robert J, MD  glipiZIDE (GLUCOTROL XL) 10 MG 24 hr tablet Take 10 mg by mouth daily. 11/23/19   [provider]  mupirocin ointment (BACTROBAN) 2 % Apply 1 Application topically daily. 06/17/23   [provider]  SEMGLEE, YFGN, 100 UNIT/ML Pen Inject into the skin daily. 06/15/23   [provider]  tamsulosin (FLOMAX) 0.4 MG CAPS capsule Take 0.4 mg by mouth daily.    [provider]  valsartan (DIOVAN) 80 MG tablet Take 80 mg by mouth daily.    [provider]    Physical Exam    Vital Signs:  Steven Dixon does not have vital signs available for review today.  Given telephonic nature of communication, physical exam is limited. AAOx3. NAD. Normal affect.  Speech and respirations are  unlabored.  Accessory Clinical Findings    None  Assessment & Plan    1.  Preoperative Cardiovascular Risk Assessment:Left Total Knee Arthroplasty   Date of Surgery:  Clearance TBD                                 Surgeon:  Dr. Sieving Higashi Surgeon's Group or Practice Name:  [facility] Phone number:  [phone] Fax number:  [phone]      Primary Cardiologist: Lamar Fitch, MD  Chart reviewed as part of pre-operative protocol coverage. Given past medical history and time since last visit, based on ACC/AHA guidelines, Steven Dixon would be at acceptable risk for the planned procedure without further cardiovascular testing.   His RCRI is moderate risk, 6.6% risk of major cardiac event.  He is able to complete greater than 4 METS of physical activity.  Patient was advised that if he  develops new symptoms prior to surgery to contact our office to arrange a follow-up appointment.  He verbalized understanding.  Per office protocol, patient can hold Eliquis  for 3 days prior to procedure.     Patient will not need bridging with Lovenox (enoxaparin) around procedure.  I will route this recommendation to the requesting party via Epic fax function and remove from pre-op pool.  Please call with questions.       Time:   Today, I have spent 6 minutes with the patient with telehealth technology discussing medical history, symptoms, and management plan.  I spent 10 minutes reviewing patient's past cardiac history and cardiac medications.    Josefa CHRISTELLA Beauvais, NP  05/23/2024, 3:44 PM

## 2024-05-24 ENCOUNTER — Ambulatory Visit: Attending: Internal Medicine

## 2024-05-24 DIAGNOSIS — Z01810 Encounter for preprocedural cardiovascular examination: Secondary | ICD-10-CM

## 2024-06-24 NOTE — Progress Notes (Signed)
 Sent message, via epic in basket, requesting orders in epic from Careers adviser.

## 2024-06-27 ENCOUNTER — Ambulatory Visit: Payer: Self-pay | Admitting: Emergency Medicine

## 2024-06-27 DIAGNOSIS — G8929 Other chronic pain: Secondary | ICD-10-CM

## 2024-06-27 NOTE — H&P (Signed)
 TOTAL KNEE ADMISSION H&P  Patient is being admitted for left total knee arthroplasty.  Subjective:  Chief Complaint:left knee pain.  HPI: Steven Dixon, 83 y.o. male, has a history of pain and functional disability in the left knee due to arthritis and has failed non-surgical conservative treatments for greater than 12 weeks to includeNSAID's and/or analgesics, corticosteriod injections, use of assistive devices, and activity modification.  Onset of symptoms was gradual, starting many years ago with gradually worsening course since that time. The patient noted no past surgery on the left knee(s).  Patient currently rates pain in the left knee(s) at 10 out of 10 with activity. Patient has night pain, worsening of pain with activity and weight bearing, pain that interferes with activities of daily living, and pain with passive range of motion.  Patient has evidence of periarticular osteophytes and joint space narrowing by imaging studies.  There is no active infection.  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   Primary localized osteoarthritis of right knee 08/14/2017   Preop cardiovascular exam 05/13/2017   Type 2 diabetes mellitus (HCC) 01/23/2017   Essential hypertension 04/22/2016   CKD (chronic kidney disease), stage III (HCC) 02/06/2016   Ischemic cardiomyopathy 02/06/2016    Cardiomyopathy (HCC) 06/21/2015   Atherosclerotic heart disease of native coronary artery without angina pectoris 06/21/2015   Dyslipidemia 06/21/2015   Past Medical History:  Diagnosis Date   Arthritis    Atherosclerotic heart disease of native coronary artery without angina pectoris 06/21/2015   Overview:  Stent to LAD and diagonal 1 in September 2009  Formatting of this note might be different from the original. STEND IN LAD AND D1 IN 2009 Formatting of this note might be different from the original. 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 EF 50-55% on ECHO 05/11/2018 with apical hypokinesis and diastolic dysfunction.   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 03/15/2008   Pre-diabetes    Primary localized osteoarthritis of knee    Right   Primary localized osteoarthritis of right knee 08/14/2017   Type 2 diabetes mellitus (HCC) 01/23/2017   Overview:  Diet controlled   Wellness examination 05/13/2017    Past Surgical History:  Procedure Laterality  Date   APPENDECTOMY     CARDIAC CATHETERIZATION     Stents X1   CATARACT EXTRACTION     CORONARY STENT PLACEMENT     EYE SURGERY     Bilateral cataracts   JOINT REPLACEMENT     KNEE CLOSED REDUCTION Right 10/14/2017   Procedure: CLOSED MANIPULATION RIGHT KNEE;  Surgeon: Shari Sieving, MD;  Location: [facility];  Service: Orthopedics;  Laterality: Right;   KNEE SURGERY     ROTATOR CUFF REPAIR     TOTAL KNEE ARTHROPLASTY Right 08/14/2017   TOTAL KNEE ARTHROPLASTY Right 08/14/2017   Procedure: RIGHT TOTAL KNEE ARTHROPLASTY;  Surgeon: Shari Sieving, MD;  Location: [facility] OR;  Service: Orthopedics;  Laterality: Right;    Current Outpatient Medications  Medication Sig Dispense Refill Last Dose/Taking   amLODipine (NORVASC) 5 MG tablet Take 7.5 mg by mouth in the morning.      apixaban  (ELIQUIS ) 2.5 MG TABS tablet Take 1 tablet (2.5 mg total) by mouth 2 (two) times daily. 60 tablet 4    atorvastatin  (LIPITOR) 20 MG tablet Take 1 tablet (20 mg total) by mouth daily. (Patient taking differently: Take 20 mg by mouth at bedtime.) 90 tablet 3    carvedilol  (COREG ) 3.125 MG tablet Take 1 tablet (3.125 mg total) by mouth 2 (two) times daily. (Patient taking differently: Take 6.25 mg by mouth daily in the afternoon.) 60 tablet 2    cholecalciferol  (VITAMIN D ) 1000 units tablet Take 1,000 Units by mouth every other day.      Coenzyme Q10 300 MG CAPS Take 300 mg by mouth daily in the afternoon.      empagliflozin (JARDIANCE) 25 MG TABS tablet Take 25 mg by mouth in the morning.      ezetimibe  (ZETIA ) 10 MG tablet TAKE 1 TABLET BY MOUTH EVERY DAY 90 tablet 1    glipiZIDE (GLUCOTROL XL) 10 MG 24 hr tablet Take 10 mg by mouth daily.      nitroGLYCERIN  (NITROSTAT ) 0.4 MG SL tablet Place 0.4 mg under the tongue every 5 (five) minutes x 3 doses as needed for chest pain.      Polyethyl Glycol-Propyl Glycol (LUBRICANT EYE DROPS) 0.4-0.3 % SOLN Place 1-2 drops into both eyes 3 (three) times daily as needed (dry/irritated eyes.).      SEMGLEE, YFGN, 100 UNIT/ML Pen Inject 17 Units into the skin at bedtime.      tamsulosin (FLOMAX) 0.4 MG CAPS capsule Take 0.4 mg by mouth every evening.      valsartan (DIOVAN) 80 MG tablet Take 80 mg by mouth daily in the afternoon.      zinc sulfate, 50mg  elemental zinc, 220 (50  Zn) MG capsule Take 220 mg by mouth every other day.      No current facility-administered medications for this visit.   Allergies[1]  Social History   Tobacco Use   Smoking status: Former   Smokeless tobacco: Never  Substance Use Topics   Alcohol use: No    Family History  Problem Relation Age of Onset   Heart disease Mother    Heart disease Father    Rheum arthritis Father      Review of Systems  Musculoskeletal:  Positive for arthralgias.  All other systems reviewed and are negative.   Objective:  Physical Exam Constitutional:      General: He is not in acute distress.    Appearance: Normal appearance. He is normal weight.  HENT:     Head:  Normocephalic and atraumatic.  Eyes:     Extraocular Movements: Extraocular movements intact.     Conjunctiva/sclera: Conjunctivae normal.     Pupils: Pupils are equal, round, and reactive to light.  Cardiovascular:     Rate and Rhythm: Normal rate and regular rhythm.     Pulses: Normal pulses.     Heart sounds: Normal heart sounds.  Pulmonary:     Effort: Pulmonary effort is normal. No respiratory distress.  Abdominal:     General: There is no distension.     Palpations: Abdomen is soft.  Musculoskeletal:        General: Tenderness present.     Cervical back: Normal range of motion and neck supple.     Comments: TTP over medial and lateral joint line, medial worse than lateral.  No calf tenderness, swelling, or erythema.  No overlying lesions of area of chief complaint.  Decreased strength and ROM due to elicited pain.  Dorsiflexion and plantarflexion intact.  Stable to varus and valgus stress.  BLE appear grossly neurovascularly intact.  Gait mildly antalgic.   Skin:    General: Skin is warm and dry.     Capillary Refill: Capillary refill takes less than 2 seconds.     Findings: No erythema or rash.  Neurological:     General: No focal deficit present.     Mental Status: He is alert and oriented to person, place, and  time.  Psychiatric:        Mood and Affect: Mood normal.        Behavior: Behavior normal.     Vital signs in last 24 hours: @VSRANGES @  Labs:   Estimated body mass index is 28.24 kg/m as calculated from the following:   Height as of 12/08/23: 5' 10 (1.778 m).   Weight as of 12/08/23: 89.3 kg.   Imaging Review Plain radiographs demonstrate severe degenerative joint disease of the left knee(s). The overall alignment issignificant varus. The bone quality appears to be fair for age and reported activity level.      Assessment/Plan:  End stage arthritis, left knee   The patient history, physical examination, clinical judgment of the provider and imaging studies are consistent with end stage degenerative joint disease of the left knee(s) and total knee arthroplasty is deemed medically necessary. The treatment options including medical management, injection therapy arthroscopy and arthroplasty were discussed at length. The risks and benefits of total knee arthroplasty were presented and reviewed. The risks due to aseptic loosening, infection, stiffness, patella tracking problems, thromboembolic complications and other imponderables were discussed. The patient acknowledged the explanation, agreed to proceed with the plan and consent was signed. Patient is being admitted for inpatient treatment for surgery, pain control, PT, OT, prophylactic antibiotics, VTE prophylaxis, progressive ambulation and ADL's and discharge planning. The patient is planning to be discharged home with HHPT ([facility])    Anticipated LOS equal to or greater than 2 midnights due to - Age 42 and older with one or more of the following:  - Obesity  - Expected need for hospital services (PT, OT, Nursing) required for safe  discharge  - Anticipated need for postoperative skilled nursing care or inpatient rehab  - Active co-morbidities: CAD, ischemic cardiomyopathy, BPH, peripheral vascular disease, Afib, insulin  dependent diabetes, HTN, HLD, CKD3, hx of MI, hx of cardiac stents OR   - Unanticipated findings during/Post Surgery: None  - Patient is a high risk of re-admission due to: None    [1]  Allergies  Allergen Reactions   Atorvastatin  Other (See Comments)   Sacubitril-Valsartan Other (See Comments)    Insomnia   Tape Dermatitis

## 2024-06-27 NOTE — H&P (View-Only) (Signed)
 TOTAL KNEE ADMISSION H&P  Patient is being admitted for left total knee arthroplasty.  Subjective:  Chief Complaint:left knee pain.  HPI: Steven Dixon, 83 y.o. male, has a history of pain and functional disability in the left knee due to arthritis and has failed non-surgical conservative treatments for greater than 12 weeks to includeNSAID's and/or analgesics, corticosteriod injections, use of assistive devices, and activity modification.  Onset of symptoms was gradual, starting many years ago with gradually worsening course since that time. The patient noted no past surgery on the left knee(s).  Patient currently rates pain in the left knee(s) at 10 out of 10 with activity. Patient has night pain, worsening of pain with activity and weight bearing, pain that interferes with activities of daily living, and pain with passive range of motion.  Patient has evidence of periarticular osteophytes and joint space narrowing by imaging studies.  There is no active infection.  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   Primary localized osteoarthritis of right knee 08/14/2017   Preop cardiovascular exam 05/13/2017   Type 2 diabetes mellitus (HCC) 01/23/2017   Essential hypertension 04/22/2016   CKD (chronic kidney disease), stage III (HCC) 02/06/2016   Ischemic cardiomyopathy 02/06/2016    Cardiomyopathy (HCC) 06/21/2015   Atherosclerotic heart disease of native coronary artery without angina pectoris 06/21/2015   Dyslipidemia 06/21/2015   Past Medical History:  Diagnosis Date   Arthritis    Atherosclerotic heart disease of native coronary artery without angina pectoris 06/21/2015   Overview:  Stent to LAD and diagonal 1 in September 2009  Formatting of this note might be different from the original. STEND IN LAD AND D1 IN 2009 Formatting of this note might be different from the original. 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 EF 50-55% on ECHO 05/11/2018 with apical hypokinesis and diastolic dysfunction.   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 03/15/2008   Pre-diabetes    Primary localized osteoarthritis of knee    Right   Primary localized osteoarthritis of right knee 08/14/2017   Type 2 diabetes mellitus (HCC) 01/23/2017   Overview:  Diet controlled   Wellness examination 05/13/2017    Past Surgical History:  Procedure Laterality  Date   APPENDECTOMY     CARDIAC CATHETERIZATION     Stents X1   CATARACT EXTRACTION     CORONARY STENT PLACEMENT     EYE SURGERY     Bilateral cataracts   JOINT REPLACEMENT     KNEE CLOSED REDUCTION Right 10/14/2017   Procedure: CLOSED MANIPULATION RIGHT KNEE;  Surgeon: Shari Sieving, MD;  Location: [facility];  Service: Orthopedics;  Laterality: Right;   KNEE SURGERY     ROTATOR CUFF REPAIR     TOTAL KNEE ARTHROPLASTY Right 08/14/2017   TOTAL KNEE ARTHROPLASTY Right 08/14/2017   Procedure: RIGHT TOTAL KNEE ARTHROPLASTY;  Surgeon: Shari Sieving, MD;  Location: [facility] OR;  Service: Orthopedics;  Laterality: Right;    Current Outpatient Medications  Medication Sig Dispense Refill Last Dose/Taking   amLODipine (NORVASC) 5 MG tablet Take 7.5 mg by mouth in the morning.      apixaban  (ELIQUIS ) 2.5 MG TABS tablet Take 1 tablet (2.5 mg total) by mouth 2 (two) times daily. 60 tablet 4    atorvastatin  (LIPITOR) 20 MG tablet Take 1 tablet (20 mg total) by mouth daily. (Patient taking differently: Take 20 mg by mouth at bedtime.) 90 tablet 3    carvedilol  (COREG ) 3.125 MG tablet Take 1 tablet (3.125 mg total) by mouth 2 (two) times daily. (Patient taking differently: Take 6.25 mg by mouth daily in the afternoon.) 60 tablet 2    cholecalciferol  (VITAMIN D ) 1000 units tablet Take 1,000 Units by mouth every other day.      Coenzyme Q10 300 MG CAPS Take 300 mg by mouth daily in the afternoon.      empagliflozin (JARDIANCE) 25 MG TABS tablet Take 25 mg by mouth in the morning.      ezetimibe  (ZETIA ) 10 MG tablet TAKE 1 TABLET BY MOUTH EVERY DAY 90 tablet 1    glipiZIDE (GLUCOTROL XL) 10 MG 24 hr tablet Take 10 mg by mouth daily.      nitroGLYCERIN  (NITROSTAT ) 0.4 MG SL tablet Place 0.4 mg under the tongue every 5 (five) minutes x 3 doses as needed for chest pain.      Polyethyl Glycol-Propyl Glycol (LUBRICANT EYE DROPS) 0.4-0.3 % SOLN Place 1-2 drops into both eyes 3 (three) times daily as needed (dry/irritated eyes.).      SEMGLEE, YFGN, 100 UNIT/ML Pen Inject 17 Units into the skin at bedtime.      tamsulosin (FLOMAX) 0.4 MG CAPS capsule Take 0.4 mg by mouth every evening.      valsartan (DIOVAN) 80 MG tablet Take 80 mg by mouth daily in the afternoon.      zinc sulfate, 50mg  elemental zinc, 220 (50  Zn) MG capsule Take 220 mg by mouth every other day.      No current facility-administered medications for this visit.   Allergies[1]  Social History   Tobacco Use   Smoking status: Former   Smokeless tobacco: Never  Substance Use Topics   Alcohol use: No    Family History  Problem Relation Age of Onset   Heart disease Mother    Heart disease Father    Rheum arthritis Father      Review of Systems  Musculoskeletal:  Positive for arthralgias.  All other systems reviewed and are negative.   Objective:  Physical Exam Constitutional:      General: He is not in acute distress.    Appearance: Normal appearance. He is normal weight.  HENT:     Head:  Normocephalic and atraumatic.  Eyes:     Extraocular Movements: Extraocular movements intact.     Conjunctiva/sclera: Conjunctivae normal.     Pupils: Pupils are equal, round, and reactive to light.  Cardiovascular:     Rate and Rhythm: Normal rate and regular rhythm.     Pulses: Normal pulses.     Heart sounds: Normal heart sounds.  Pulmonary:     Effort: Pulmonary effort is normal. No respiratory distress.  Abdominal:     General: There is no distension.     Palpations: Abdomen is soft.  Musculoskeletal:        General: Tenderness present.     Cervical back: Normal range of motion and neck supple.     Comments: TTP over medial and lateral joint line, medial worse than lateral.  No calf tenderness, swelling, or erythema.  No overlying lesions of area of chief complaint.  Decreased strength and ROM due to elicited pain.  Dorsiflexion and plantarflexion intact.  Stable to varus and valgus stress.  BLE appear grossly neurovascularly intact.  Gait mildly antalgic.   Skin:    General: Skin is warm and dry.     Capillary Refill: Capillary refill takes less than 2 seconds.     Findings: No erythema or rash.  Neurological:     General: No focal deficit present.     Mental Status: He is alert and oriented to person, place, and  time.  Psychiatric:        Mood and Affect: Mood normal.        Behavior: Behavior normal.     Vital signs in last 24 hours: @VSRANGES @  Labs:   Estimated body mass index is 28.24 kg/m as calculated from the following:   Height as of 12/08/23: 5' 10 (1.778 m).   Weight as of 12/08/23: 89.3 kg.   Imaging Review Plain radiographs demonstrate severe degenerative joint disease of the left knee(s). The overall alignment issignificant varus. The bone quality appears to be fair for age and reported activity level.      Assessment/Plan:  End stage arthritis, left knee   The patient history, physical examination, clinical judgment of the provider and imaging studies are consistent with end stage degenerative joint disease of the left knee(s) and total knee arthroplasty is deemed medically necessary. The treatment options including medical management, injection therapy arthroscopy and arthroplasty were discussed at length. The risks and benefits of total knee arthroplasty were presented and reviewed. The risks due to aseptic loosening, infection, stiffness, patella tracking problems, thromboembolic complications and other imponderables were discussed. The patient acknowledged the explanation, agreed to proceed with the plan and consent was signed. Patient is being admitted for inpatient treatment for surgery, pain control, PT, OT, prophylactic antibiotics, VTE prophylaxis, progressive ambulation and ADL's and discharge planning. The patient is planning to be discharged home with HHPT ([facility])    Anticipated LOS equal to or greater than 2 midnights due to - Age 42 and older with one or more of the following:  - Obesity  - Expected need for hospital services (PT, OT, Nursing) required for safe  discharge  - Anticipated need for postoperative skilled nursing care or inpatient rehab  - Active co-morbidities: CAD, ischemic cardiomyopathy, BPH, peripheral vascular disease, Afib, insulin  dependent diabetes, HTN, HLD, CKD3, hx of MI, hx of cardiac stents OR   - Unanticipated findings during/Post Surgery: None  - Patient is a high risk of re-admission due to: None    [1]  Allergies  Allergen Reactions   Atorvastatin  Other (See Comments)   Sacubitril-Valsartan Other (See Comments)    Insomnia   Tape Dermatitis

## 2024-07-01 NOTE — Patient Instructions (Addendum)
 SURGICAL WAITING ROOM VISITATION  Patients having surgery or a procedure may have no more than 2 support people in the waiting area - these visitors may rotate.    Children ages 50 and under will not be able to visit patients in [facility] under most circumstances.   Visitors with respiratory illnesses are discouraged from visiting and should remain at home.  If the patient needs to stay at the hospital during part of their recovery, the visitor guidelines for inpatient rooms apply. Pre-op nurse will coordinate an appropriate time for 1 support person to accompany patient in pre-op.  This support person may not rotate.    Please refer to the [facility] website for the visitor guidelines for Inpatients (after your surgery is over and you are in a regular room).       Your procedure is scheduled on: 07/13/24   Report to [facility] Main Entrance    Report to admitting at 10:50 AM   Call this number if you have problems the morning of surgery [phone]   Do not eat food :After Midnight.   After Midnight you may have the following liquids until 10:20 AM DAY OF SURGERY  Water Non-Citrus Juices (without pulp, NO RED-Apple, White grape, White cranberry) Black Coffee (NO MILK/CREAM OR CREAMERS, sugar ok)  Clear Tea (NO MILK/CREAM OR CREAMERS, sugar ok) regular and decaf                             Plain Jell-O (NO RED)                                           Fruit ices (not with fruit pulp, NO RED)                                     Popsicles (NO RED)                                                               Sports drinks like Gatorade (NO RED)          The day of surgery:  Drink ONE (1) Pre-Surgery G2 at 10:20 AM the morning of surgery. Drink in one sitting. Do not sip.  This drink was given to you during your hospital  pre-op appointment visit. Nothing else to drink after completing the  Pre-Surgery G2.     Oral Hygiene is also important to reduce  your risk of infection.                                    Remember - BRUSH YOUR TEETH THE MORNING OF SURGERY WITH YOUR REGULAR TOOTHPASTE    Stop all vitamins and herbal supplements 7 days before surgery.   Take these medicines the morning of surgery with A SIP OF WATER: amlodipine, carvedilol , ezetimibe , tamsulosin  DO NOT TAKE ANY ORAL DIABETIC MEDICATIONS DAY OF YOUR SURGERY Hold Glipizide the morning of surgery.  Hold Jardiance 72  hours prior to surgery. Last dose to be 07/09/24 Only take 1/2 of insuline dose the night before surgery.              You may not have any metal on your body including hair pins, jewelry, and body piercing             Do not wear make-up, lotions, powders, perfumes/cologne, or deodorant              Men may shave face and neck.   Do not bring valuables to the hospital. [facility] IS NOT             RESPONSIBLE   FOR VALUABLES.   Contacts, glasses, dentures or bridgework may not be worn into surgery.   Bring small overnight bag day of surgery.   DO NOT BRING YOUR HOME MEDICATIONS TO THE HOSPITAL. PHARMACY WILL DISPENSE MEDICATIONS LISTED ON YOUR MEDICATION LIST TO YOU DURING YOUR ADMISSION IN THE HOSPITAL!    Patients discharged on the day of surgery will not be allowed to drive home.  Someone NEEDS to stay with you for the first 24 hours after anesthesia.   Special Instructions: Bring a copy of your healthcare power of attorney and living will documents the day of surgery if you haven't scanned them before.              Please read over the following fact sheets you were given: IF YOU HAVE QUESTIONS ABOUT YOUR PRE-OP INSTRUCTIONS PLEASE CALL [phone] [place]   If you received a COVID test during your pre-op visit  it is requested that you wear a mask when out in public, stay away from anyone that may not be feeling well and notify your surgeon if you develop symptoms. If you test positive for Covid or have been in contact with anyone that has  tested positive in the last 10 days please notify you surgeon.      Pre-operative 4 CHG Bath Instructions  DYNA-Hex 4 Chlorhexidine  Gluconate 4% Solution Antiseptic 4 fl. oz   You can play a key role in reducing the risk of infection after surgery. Your skin needs to be as free of germs as possible. You can reduce the number of germs on your skin by washing with CHG (chlorhexidine  gluconate) soap before surgery. CHG is an antiseptic soap that kills germs and continues to kill germs even after washing.   DO NOT use if you have an allergy to chlorhexidine /CHG or antibacterial soaps. If your skin becomes reddened or irritated, stop using the CHG and notify one of our RNs at   Please shower with the CHG soap starting 4 days before surgery using the following schedule:     Please keep in mind the following:  DO NOT shave, including legs and underarms, starting the day of your first shower.   You may shave your face at any point before/day of surgery.  Place clean sheets on your bed the day you start using CHG soap. Use a clean washcloth (not used since being washed) for each shower. DO NOT sleep with pets once you start using the CHG.  CHG Shower Instructions:  If you choose to wash your hair and private area, wash first with your normal shampoo/soap.  After you use shampoo/soap, rinse your hair and body thoroughly to remove shampoo/soap residue.  Turn the water OFF and apply about 3 tablespoons (45 ml) of CHG soap to a CLEAN washcloth.  Apply CHG soap ONLY FROM  YOUR NECK DOWN TO YOUR TOES (washing for 3-5 minutes)  DO NOT use CHG soap on face, private areas, open wounds, or sores.  Pay special attention to the area where your surgery is being performed.  If you are having back surgery, having someone wash your back for you may be helpful. Wait 2 minutes after CHG soap is applied, then you may rinse off the CHG soap.  Pat dry with a clean towel  Put on clean clothes/pajamas   If you  choose to wear lotion, please use ONLY the CHG-compatible lotions on the back of this paper.     Additional instructions for the day of surgery: DO NOT APPLY any lotions, deodorants, cologne, or perfumes.   Put on clean/comfortable clothes.  Brush your teeth.  Ask your nurse before applying any prescription medications to the skin.   CHG Compatible Lotions   Aveeno Moisturizing lotion  Cetaphil Moisturizing Cream  Cetaphil Moisturizing Lotion  Clairol Herbal Essence Moisturizing Lotion, Dry Skin  Clairol Herbal Essence Moisturizing Lotion, Extra Dry Skin  Clairol Herbal Essence Moisturizing Lotion, Normal Skin  Curel Age Defying Therapeutic Moisturizing Lotion with Alpha Hydroxy  Curel Extreme Care Body Lotion  Curel Soothing Hands Moisturizing Hand Lotion  Curel Therapeutic Moisturizing Cream, Fragrance-Free  Curel Therapeutic Moisturizing Lotion, Fragrance-Free  Curel Therapeutic Moisturizing Lotion, Original Formula  Eucerin Daily Replenishing Lotion  Eucerin Dry Skin Therapy Plus Alpha Hydroxy Crme  Eucerin Dry Skin Therapy Plus Alpha Hydroxy Lotion  Eucerin Original Crme  Eucerin Original Lotion  Eucerin Plus Crme Eucerin Plus Lotion  Eucerin TriLipid Replenishing Lotion  Keri Anti-Bacterial Hand Lotion  Keri Deep Conditioning Original Lotion Dry Skin Formula Softly Scented  Keri Deep Conditioning Original Lotion, Fragrance Free Sensitive Skin Formula  Keri Lotion Fast Absorbing Fragrance Free Sensitive Skin Formula  Keri Lotion Fast Absorbing Softly Scented Dry Skin Formula  Keri Original Lotion  Keri Skin Renewal Lotion Keri Silky Smooth Lotion  Keri Silky Smooth Sensitive Skin Lotion  Nivea Body Creamy Conditioning Oil  Nivea Body Extra Enriched Lotion  Nivea Body Original Lotion  Nivea Body Sheer Moisturizing Lotion Nivea Crme  Nivea Skin Firming Lotion  NutraDerm 30 Skin Lotion  NutraDerm Skin Lotion  NutraDerm Therapeutic Skin Cream  NutraDerm Therapeutic  Skin Lotion  ProShield Protective Hand Cream Incentive Spirometer  An incentive spirometer is a tool that can help keep your lungs clear and active. This tool measures how well you are filling your lungs with each breath. Taking long deep breaths may help reverse or decrease the chance of developing breathing (pulmonary) problems (especially infection) following: A long period of time when you are unable to move or be active. BEFORE THE PROCEDURE  If the spirometer includes an indicator to show your best effort, your nurse or respiratory therapist will set it to a desired goal. If possible, sit up straight or lean slightly forward. Try not to slouch. Hold the incentive spirometer in an upright position. INSTRUCTIONS FOR USE  Sit on the edge of your bed if possible, or sit up as far as you can in bed or on a chair. Hold the incentive spirometer in an upright position. Breathe out normally. Place the mouthpiece in your mouth and seal your lips tightly around it. Breathe in slowly and as deeply as possible, raising the piston or the ball toward the top of the column. Hold your breath for 3-5 seconds or for as long as possible. Allow the piston or ball to fall to the bottom  of the column. Remove the mouthpiece from your mouth and breathe out normally. Rest for a few seconds and repeat Steps 1 through 7 at least 10 times every 1-2 hours when you are awake. Take your time and take a few normal breaths between deep breaths. The spirometer may include an indicator to show your best effort. Use the indicator as a goal to work toward during each repetition. After each set of 10 deep breaths, practice coughing to be sure your lungs are clear. If you have an incision (the cut made at the time of surgery), support your incision when coughing by placing a pillow or rolled up towels firmly against it. Once you are able to get out of bed, walk around indoors and cough well. You may stop using the incentive  spirometer when instructed by your caregiver.  RISKS AND COMPLICATIONS Take your time so you do not get dizzy or light-headed. If you are in pain, you may need to take or ask for pain medication before doing incentive spirometry. It is harder to take a deep breath if you are having pain. AFTER USE Rest and breathe slowly and easily. It can be helpful to keep track of a log of your progress. Your caregiver can provide you with a simple table to help with this. If you are using the spirometer at home, follow these instructions: SEEK MEDICAL CARE IF:  You are having difficultly using the spirometer. You have trouble using the spirometer as often as instructed. Your pain medication is not giving enough relief while using the spirometer. You develop fever of 100.5 F (38.1 C) or higher. SEEK IMMEDIATE MEDICAL CARE IF:  You cough up bloody sputum that had not been present before. You develop fever of 102 F (38.9 C) or greater. You develop worsening pain at or near the incision site. MAKE SURE YOU:  Understand these instructions. Will watch your condition. Will get help right away if you are not doing well or get worse. Document Released: 11/03/2006 Document Revised: 09/15/2011 Document Reviewed: 01/04/2007   WHAT IS A BLOOD TRANSFUSION? Blood Transfusion Information  A transfusion is the replacement of blood or some of its parts. Blood is made up of multiple cells which provide different functions. Red blood cells carry oxygen and are used for blood loss replacement. White blood cells fight against infection. Platelets control bleeding. Plasma helps clot blood. Other blood products are available for specialized needs, such as hemophilia or other clotting disorders. BEFORE THE TRANSFUSION  Who gives blood for transfusions?  Healthy volunteers who are fully evaluated to make sure their blood is safe. This is blood bank blood. Transfusion therapy is the safest it has ever been in the  practice of medicine. Before blood is taken from a donor, a complete history is taken to make sure that person has no history of diseases nor engages in risky social behavior (examples are intravenous drug use or sexual activity with multiple partners). The donor's travel history is screened to minimize risk of transmitting infections, such as malaria. The donated blood is tested for signs of infectious diseases, such as HIV and hepatitis. The blood is then tested to be sure it is compatible with you in order to minimize the chance of a transfusion reaction. If you or a relative donates blood, this is often done in anticipation of surgery and is not appropriate for emergency situations. It takes many days to process the donated blood. RISKS AND COMPLICATIONS Although transfusion therapy is very safe and  saves many lives, the main dangers of transfusion include:  Getting an infectious disease. Developing a transfusion reaction. This is an allergic reaction to something in the blood you were given. Every precaution is taken to prevent this. The decision to have a blood transfusion has been considered carefully by your caregiver before blood is given. Blood is not given unless the benefits outweigh the risks. AFTER THE TRANSFUSION Right after receiving a blood transfusion, you will usually feel much better and more energetic. This is especially true if your red blood cells have gotten low (anemic). The transfusion raises the level of the red blood cells which carry oxygen, and this usually causes an energy increase. The nurse administering the transfusion will monitor you carefully for complications. HOME CARE INSTRUCTIONS  No special instructions are needed after a transfusion. You may find your energy is better. Speak with your caregiver about any limitations on activity for underlying diseases you may have. SEEK MEDICAL CARE IF:  Your condition is not improving after your transfusion. You develop  redness or irritation at the intravenous (IV) site. SEEK IMMEDIATE MEDICAL CARE IF:  Any of the following symptoms occur over the next 12 hours: Shaking chills. You have a temperature by mouth above 102 F (38.9 C), not controlled by medicine. Chest, back, or muscle pain. People around you feel you are not acting correctly or are confused. Shortness of breath or difficulty breathing. Dizziness and fainting. You get a rash or develop hives. You have a decrease in urine output. Your urine turns a dark color or changes to pink, red, or brown. Any of the following symptoms occur over the next 10 days: You have a temperature by mouth above 102 F (38.9 C), not controlled by medicine. Shortness of breath. Weakness after normal activity. The white part of the eye turns yellow (jaundice). You have a decrease in the amount of urine or are urinating less often. Your urine turns a dark color or changes to pink, red, or brown.   .How to Manage Your Diabetes Before and After Surgery  Why is it important to control my blood sugar before and after surgery? Improving blood sugar levels before and after surgery helps healing and can limit problems. A way of improving blood sugar control is eating a healthy diet by:  Eating less sugar and carbohydrates  Increasing activity/exercise  Talking with your doctor about reaching your blood sugar goals High blood sugars (greater than 180 mg/dL) can raise your risk of infections and slow your recovery, so you will need to focus on controlling your diabetes during the weeks before surgery. Make sure that the doctor who takes care of your diabetes knows about your planned surgery including the date and location.  How do I manage my blood sugar before surgery? Check your blood sugar at least 4 times a day, starting 2 days before surgery, to make sure that the level is not too high or low. Check your blood sugar the morning of your surgery when you wake up and  every 2 hours until you get to the Short Stay unit. If your blood sugar is less than 70 mg/dL, you will need to treat for low blood sugar: Do not take insulin. Treat a low blood sugar (less than 70 mg/dL) with  cup of clear juice (cranberry or apple), 4 glucose tablets, OR glucose gel. Recheck blood sugar in 15 minutes after treatment (to make sure it is greater than 70 mg/dL). If your blood sugar is not greater  than 70 mg/dL on recheck, call [phone] for further instructions. Report your blood sugar to the short stay nurse when you get to Short Stay.  If you are admitted to the hospital after surgery: Your blood sugar will be checked by the staff and you will probably be given insulin after surgery (instead of oral diabetes medicines) to make sure you have good blood sugar levels. The goal for blood sugar control after surgery is 80-180 mg/dL.   WHAT DO I DO ABOUT MY DIABETES MEDICATION?  Do not take oral diabetes medicines (pills) the morning of surgery.Hold Glipizide the morning of surgery. Hold Jardiance for 72 hours prior to surgery. Last dose to be 07/09/24  THE NIGHT BEFORE SURGERY  Only take half of insulin dose at bedtime.      Patient Signature:  Date:   Nurse Signature:  Date:   Reviewed and Endorsed by [facility] Patient Education Committee, August 2015

## 2024-07-01 NOTE — Progress Notes (Addendum)
 COVID Vaccine received:  []  No [x]  Yes Date of any COVID positive Test in last 90 days: no PCP - Powell [place] MD Cardiologist - Lamar Fitch MD  Chest x-ray - 08/03/17 Epic EKG -  07/04/24 Epic Stress Test -  ECHO - 01/27/24 Epic Cardiac Cath -   Cardiac clearance 05/24/24- Josefa Beauvais NP  Bowel Prep - [x]  No  []   Yes ______  Pacemaker / ICD device [x]  No []  Yes   Spinal Cord Stimulator:[x]  No []  Yes       History of Sleep Apnea? [x]  No []  Yes   CPAP used?- [x]  No []  Yes    Does the patient monitor blood sugar?          []  No [x]  Yes  []  N/A  Patient has: []  NO Hx DM   []  Pre-DM                 []  DM1  [x]   DM2 Does patient have a [employer] or Dexacom? [x]  No []  Yes   Fasting Blood Sugar Ranges- 127 Checks Blood Sugar ___1__ times a day  GLP1 agonist / usual dose - no GLP1 instructions:  SGLT-2 inhibitors / usual dose - Jardiance hold 72 hours Last dose to be 07/09/24 SGLT-2 instructions:   Blood Thinner / Instructions:Eliquis  Hold 3 days prior to surgery. Last dose to be 07/09/24 PM dose Aspirin Instructions:no  Comments:   Activity level: Patient is able to climb a flight of stairs without difficulty; [x]  No CP  [x]  No SOB,    Patient can perform ADLs without assistance.   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  Patient denies shortness of breath, fever, cough and chest pain at PAT appointment.  Patient verbalized understanding and agreement to the Pre-Surgical Instructions that were given to them at this PAT appointment. Patient was also educated of the need to review these PAT instructions again prior to his/her surgery.I reviewed the appropriate phone numbers to call if they have any and questions or concerns.

## 2024-07-04 ENCOUNTER — Other Ambulatory Visit: Payer: Self-pay

## 2024-07-04 ENCOUNTER — Encounter (HOSPITAL_COMMUNITY)
Admission: RE | Admit: 2024-07-04 | Discharge: 2024-07-04 | Disposition: A | Discharge status: Home / Self Care | Source: Ambulatory Visit | Attending: Orthopedic Surgery | Admitting: Orthopedic Surgery

## 2024-07-04 ENCOUNTER — Encounter (HOSPITAL_COMMUNITY): Payer: Self-pay

## 2024-07-04 VITALS — BP 125/71 | HR 59 | Temp 98.3°F | Resp 18 | Ht 70.0 in | Wt 190.0 lb

## 2024-07-04 DIAGNOSIS — I1310 Hypertensive heart and chronic kidney disease without heart failure, with stage 1 through stage 4 chronic kidney disease, or unspecified chronic kidney disease: Secondary | ICD-10-CM | POA: Diagnosis not present

## 2024-07-04 DIAGNOSIS — I255 Ischemic cardiomyopathy: Secondary | ICD-10-CM | POA: Diagnosis not present

## 2024-07-04 DIAGNOSIS — M25562 Pain in left knee: Secondary | ICD-10-CM | POA: Insufficient documentation

## 2024-07-04 DIAGNOSIS — Z87891 Personal history of nicotine dependence: Secondary | ICD-10-CM | POA: Diagnosis not present

## 2024-07-04 DIAGNOSIS — I2510 Atherosclerotic heart disease of native coronary artery without angina pectoris: Secondary | ICD-10-CM | POA: Insufficient documentation

## 2024-07-04 DIAGNOSIS — I480 Paroxysmal atrial fibrillation: Secondary | ICD-10-CM | POA: Diagnosis not present

## 2024-07-04 DIAGNOSIS — N183 Chronic kidney disease, stage 3 unspecified: Secondary | ICD-10-CM | POA: Diagnosis not present

## 2024-07-04 DIAGNOSIS — Z01818 Encounter for other preprocedural examination: Secondary | ICD-10-CM | POA: Insufficient documentation

## 2024-07-04 DIAGNOSIS — I5032 Chronic diastolic (congestive) heart failure: Secondary | ICD-10-CM | POA: Diagnosis not present

## 2024-07-04 DIAGNOSIS — Z794 Long term (current) use of insulin: Secondary | ICD-10-CM | POA: Diagnosis not present

## 2024-07-04 DIAGNOSIS — I10 Essential (primary) hypertension: Secondary | ICD-10-CM

## 2024-07-04 DIAGNOSIS — M170 Bilateral primary osteoarthritis of knee: Secondary | ICD-10-CM | POA: Insufficient documentation

## 2024-07-04 DIAGNOSIS — E1122 Type 2 diabetes mellitus with diabetic chronic kidney disease: Secondary | ICD-10-CM | POA: Insufficient documentation

## 2024-07-04 DIAGNOSIS — Z7901 Long term (current) use of anticoagulants: Secondary | ICD-10-CM | POA: Insufficient documentation

## 2024-07-04 DIAGNOSIS — Z9861 Coronary angioplasty status: Secondary | ICD-10-CM | POA: Insufficient documentation

## 2024-07-04 DIAGNOSIS — I252 Old myocardial infarction: Secondary | ICD-10-CM | POA: Insufficient documentation

## 2024-07-04 DIAGNOSIS — G8929 Other chronic pain: Secondary | ICD-10-CM | POA: Insufficient documentation

## 2024-07-04 DIAGNOSIS — E119 Type 2 diabetes mellitus without complications: Secondary | ICD-10-CM

## 2024-07-04 HISTORY — DX: Cardiac arrhythmia, unspecified: I49.9

## 2024-07-04 HISTORY — DX: Personal history of urinary calculi: Z87.442

## 2024-07-04 HISTORY — DX: Unspecified atrial fibrillation: I48.91

## 2024-07-04 LAB — CBC WITH DIFFERENTIAL/PLATELET
Abs Immature Granulocytes: 0.01 K/uL (ref 0.00–0.07)
Basophils Absolute: 0.1 K/uL (ref 0.0–0.1)
Basophils Relative: 1 %
Eosinophils Absolute: 0.9 K/uL — ABNORMAL HIGH (ref 0.0–0.5)
Eosinophils Relative: 11 %
HCT: 45 % (ref 39.0–52.0)
Hemoglobin: 14.4 g/dL (ref 13.0–17.0)
Immature Granulocytes: 0 %
Lymphocytes Relative: 19 %
Lymphs Abs: 1.6 K/uL (ref 0.7–4.0)
MCH: 28.7 pg (ref 26.0–34.0)
MCHC: 32 g/dL (ref 30.0–36.0)
MCV: 89.6 fL (ref 80.0–100.0)
Monocytes Absolute: 0.8 K/uL (ref 0.1–1.0)
Monocytes Relative: 10 %
Neutro Abs: 4.8 K/uL (ref 1.7–7.7)
Neutrophils Relative %: 59 %
Platelets: 262 K/uL (ref 150–400)
RBC: 5.02 MIL/uL (ref 4.22–5.81)
RDW: 14.1 % (ref 11.5–15.5)
WBC: 8.3 K/uL (ref 4.0–10.5)
nRBC: 0 % (ref 0.0–0.2)

## 2024-07-04 LAB — SURGICAL PCR SCREEN
MRSA, PCR: NEGATIVE
Staphylococcus aureus: NEGATIVE

## 2024-07-04 LAB — HEMOGLOBIN A1C
Hgb A1c MFr Bld: 7 % — ABNORMAL HIGH (ref 4.8–5.6)
Mean Plasma Glucose: 154.2 mg/dL

## 2024-07-08 ENCOUNTER — Encounter (HOSPITAL_COMMUNITY): Payer: Self-pay

## 2024-07-08 NOTE — Anesthesia Preprocedure Evaluation (Addendum)
"                                    Anesthesia Evaluation  Patient identified by MRN, date of birth, ID band Patient awake    Reviewed: Allergy & Precautions, H&P , NPO status , Patient's Chart, lab work & pertinent test results, reviewed documented beta blocker date and time   Airway Mallampati: III  TM Distance: >3 FB Neck ROM: Full    Dental no notable dental hx. (+) Teeth Intact, Chipped, Dental Advisory Given   Pulmonary former smoker   Pulmonary exam normal breath sounds clear to auscultation       Cardiovascular hypertension, Pt. on medications and Pt. on home beta blockers + CAD, + Past MI and + Cardiac Stents  + dysrhythmias Atrial Fibrillation  Rhythm:Regular Rate:Normal     Neuro/Psych negative neurological ROS  negative psych ROS   GI/Hepatic negative GI ROS, Neg liver ROS,,,  Endo/Other  diabetes, Type 2, Oral Hypoglycemic Agents    Renal/GU Renal InsufficiencyRenal disease  negative genitourinary   Musculoskeletal  (+) Arthritis , Osteoarthritis,    Abdominal   Peds  Hematology negative hematology ROS (+)   Anesthesia Other Findings   Reproductive/Obstetrics negative OB ROS                              Anesthesia Physical Anesthesia Plan  ASA: 3  Anesthesia Plan: Spinal   Post-op Pain Management: Regional block* and Tylenol  PO (pre-op)*   Induction: Intravenous  PONV Risk Score and Plan: 2 and Ondansetron , Dexamethasone  and Treatment may vary due to age or medical condition  Airway Management Planned: Natural Airway and Simple Face Mask  Additional Equipment:   Intra-op Plan:   Post-operative Plan:   Informed Consent: I have reviewed the patients History and Physical, chart, labs and discussed the procedure including the risks, benefits and alternatives for the proposed anesthesia with the patient or authorized representative who has indicated his/her understanding and acceptance.   Patient has  DNR.  Discussed DNR with patient and Suspend DNR.   Dental advisory given  Plan Discussed with: CRNA  Anesthesia Plan Comments: (See PAT note from 12/29)         Anesthesia Quick Evaluation  "

## 2024-07-08 NOTE — Progress Notes (Signed)
 " Case: [id number] Date/Time: 07/13/24 1308   Procedure: ARTHROPLASTY, KNEE, TOTAL (Left: Knee)   Anesthesia type: Spinal   Pre-op diagnosis: OA LEFT KNEE   Location: [facility] ROOM 08 / [facility]   Surgeons: Edna Toribio LABOR, MD       DISCUSSION: Steven Dixon is an 84 yo male with PMH of former smoking, HTN, hx of MI, CAD s/p PCI x 2, ischemic CM with recovered EF, HFpEF, PAF on Eliquis , T2DM (A1c 7), CKD3, arthritis  Patient follows with Cardiology for hx of MI and CAD sp PCI to diagonal in 2009 and to circumflex in 09/2022. Also with PAF and is on Eliquis . Last seen by [facility] on 12/08/23. Noted to be doing well symptom wise. Advised to undergo updated Echo which showed normal LVEF and Grade I DD. He had cardiac clearance tele visit on 05/24/24 and was cleared:  Chart reviewed as part of pre-operative protocol coverage. Given past medical history and time since last visit, based on ACC/AHA guidelines, Steven Dixon would be at acceptable risk for the planned procedure without further cardiovascular testing.  His RCRI is moderate risk, 6.6% risk of major cardiac event.  He is able to complete greater than 4 METS of physical activity.  LD Eliquis : 1/3 LD Jardiance: 1/3  VS: BP 125/71   Pulse (!) 59   Temp 36.8 C (Oral)   Resp 18   Ht 5' 10 (1.778 m)   Wt 86.2 kg   SpO2 97%   BMI 27.26 kg/m   PROVIDERS: Spry, Powell HERO., MD   LABS: Labs reviewed: Acceptable for surgery. Patient with hx of CKD3 and BMP not done at pre op. Will add for DOS (all labs ordered are listed, but only abnormal results are displayed)  Labs Reviewed  HEMOGLOBIN A1C - Abnormal; Notable for the following components:      Result Value   Hgb A1c MFr Bld 7.0 (*)    All other components within normal limits  CBC WITH DIFFERENTIAL/PLATELET - Abnormal; Notable for the following components:   Eosinophils Absolute 0.9 (*)    All other components within normal limits  SURGICAL PCR SCREEN  TYPE AND SCREEN       EKG 07/04/24:  Sinus bradycardia, rate 55 LAFB Anteroseptal infarct, age undetermined   Echo 01/27/24:  IMPRESSIONS    1. Left ventricular ejection fraction, by estimation, is 60 to 65%. The left ventricle has normal function. The left ventricle has no regional wall motion abnormalities. There is mild left ventricular hypertrophy. Left ventricular diastolic parameters are consistent with Grade I diastolic dysfunction (impaired relaxation). The average left ventricular global longitudinal strain is -13.5 %. The global longitudinal strain is abnormal.  2. Right ventricular systolic function is normal. The right ventricular size is normal. There is normal pulmonary artery systolic pressure.  3. The mitral valve is normal in structure. No evidence of mitral valve regurgitation. No evidence of mitral stenosis.  4. The aortic valve is normal in structure. Aortic valve regurgitation is mild. No aortic stenosis is present.  5. The inferior vena cava is normal in size with greater than 50% respiratory variability, suggesting right atrial pressure of 3 mmHg.  Cath 09/15/22 ([facility]):  Narrative This result has an attachment that is not available.   Severe obstructive multi-vessel disease present, predominantly involving the circumflex artery, first diagonal and right coronary artery.   RPDA lesion is 75% stenosed.   1st Diag lesion is 100% stenosed.   Mid Cx lesion is 99% stenosed,  reduced to 0%.  Past Medical History:  Diagnosis Date   Arthritis    Atherosclerotic heart disease of native coronary artery without angina pectoris 06/21/2015   Overview:  Stent to LAD and diagonal 1 in September 2009  Formatting of this note might be different from the original. STEND IN LAD AND D1 IN 2009 Formatting of this note might be different from the original. 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 EF 50-55% on ECHO 05/11/2018 with apical hypokinesis and diastolic dysfunction.   MI (mitral incompetence)    Myocardial infarction [facility])    Old MI (myocardial infarction) 02/06/2016   Overview:  03/15/2008  Formatting of this note might be different from the original. 03/15/2008   Pre-diabetes    Primary localized osteoarthritis of knee    Right   Primary localized osteoarthritis of right knee 08/14/2017   Type 2 diabetes mellitus (HCC) 01/23/2017   Overview:  Diet controlled   Wellness examination 05/13/2017    Past Surgical History:  Procedure Laterality Date   APPENDECTOMY     CARDIAC CATHETERIZATION     stents x3   CATARACT EXTRACTION     CORONARY STENT PLACEMENT     EYE SURGERY     Bilateral cataracts   JOINT REPLACEMENT     KNEE CLOSED REDUCTION Right 10/14/2017   Procedure: CLOSED MANIPULATION RIGHT KNEE;  Surgeon: Shari Sieving, MD;  Location: [facility];  Service: Orthopedics;  Laterality: Right;   KNEE SURGERY     ROTATOR CUFF REPAIR     TOTAL KNEE ARTHROPLASTY Right 08/14/2017   TOTAL KNEE ARTHROPLASTY Right 08/14/2017   Procedure: RIGHT TOTAL KNEE ARTHROPLASTY;  Surgeon: Shari Sieving, MD;  Location: [facility];  Service: Orthopedics;  Laterality: Right;    MEDICATIONS:  amLODipine (NORVASC) 5 MG tablet   apixaban  (ELIQUIS ) 2.5 MG TABS tablet   atorvastatin  (LIPITOR) 20 MG tablet   carvedilol  (COREG ) 3.125 MG tablet   cholecalciferol  (VITAMIN D ) 1000 units tablet   Coenzyme Q10 300 MG CAPS   empagliflozin  (JARDIANCE) 25 MG TABS tablet   ezetimibe  (ZETIA ) 10 MG tablet   glipiZIDE (GLUCOTROL XL) 10 MG 24 hr tablet   nitroGLYCERIN  (NITROSTAT ) 0.4 MG SL tablet   Polyethyl Glycol-Propyl Glycol (LUBRICANT EYE DROPS) 0.4-0.3 % SOLN   SEMGLEE, YFGN, 100 UNIT/ML Pen   tamsulosin (FLOMAX) 0.4 MG CAPS capsule   valsartan (DIOVAN) 80 MG tablet   zinc sulfate, 50mg  elemental zinc, 220 (50 Zn) MG capsule   No current facility-administered medications for this encounter.   Burnard CHRISTELLA Odis DEVONNA [facility] Short Stay/Anesthesiology [facility] Phone [phone] 07/08/2024 10:59 AM        "

## 2024-07-13 ENCOUNTER — Observation Stay (HOSPITAL_COMMUNITY)

## 2024-07-13 ENCOUNTER — Encounter (HOSPITAL_COMMUNITY)
Admission: RE | Disposition: A | Discharge status: Home / Self Care | Payer: Self-pay | Source: Home / Self Care | Attending: Orthopedic Surgery

## 2024-07-13 ENCOUNTER — Encounter (HOSPITAL_COMMUNITY): Payer: Self-pay | Admitting: Medical

## 2024-07-13 ENCOUNTER — Other Ambulatory Visit: Payer: Self-pay

## 2024-07-13 ENCOUNTER — Ambulatory Visit (HOSPITAL_COMMUNITY): Payer: Self-pay | Admitting: Medical

## 2024-07-13 ENCOUNTER — Observation Stay (HOSPITAL_COMMUNITY)
Admission: RE | Admit: 2024-07-13 | Discharge: 2024-07-14 | Disposition: A | Discharge status: Home / Self Care | Attending: Orthopedic Surgery | Admitting: Orthopedic Surgery

## 2024-07-13 ENCOUNTER — Encounter (HOSPITAL_COMMUNITY): Payer: Self-pay | Admitting: Orthopedic Surgery

## 2024-07-13 DIAGNOSIS — M1712 Unilateral primary osteoarthritis, left knee: Principal | ICD-10-CM | POA: Diagnosis present

## 2024-07-13 DIAGNOSIS — I129 Hypertensive chronic kidney disease with stage 1 through stage 4 chronic kidney disease, or unspecified chronic kidney disease: Secondary | ICD-10-CM | POA: Insufficient documentation

## 2024-07-13 DIAGNOSIS — Z79899 Other long term (current) drug therapy: Secondary | ICD-10-CM | POA: Diagnosis not present

## 2024-07-13 DIAGNOSIS — I4891 Unspecified atrial fibrillation: Secondary | ICD-10-CM

## 2024-07-13 DIAGNOSIS — Z87891 Personal history of nicotine dependence: Secondary | ICD-10-CM | POA: Insufficient documentation

## 2024-07-13 DIAGNOSIS — N183 Chronic kidney disease, stage 3 unspecified: Secondary | ICD-10-CM | POA: Diagnosis not present

## 2024-07-13 DIAGNOSIS — I2510 Atherosclerotic heart disease of native coronary artery without angina pectoris: Secondary | ICD-10-CM | POA: Insufficient documentation

## 2024-07-13 DIAGNOSIS — Z96651 Presence of right artificial knee joint: Secondary | ICD-10-CM | POA: Diagnosis not present

## 2024-07-13 DIAGNOSIS — I10 Essential (primary) hypertension: Secondary | ICD-10-CM

## 2024-07-13 DIAGNOSIS — E1122 Type 2 diabetes mellitus with diabetic chronic kidney disease: Secondary | ICD-10-CM | POA: Insufficient documentation

## 2024-07-13 HISTORY — PX: TOTAL KNEE ARTHROPLASTY: SHX125

## 2024-07-13 LAB — BASIC METABOLIC PANEL WITH GFR
Anion gap: 12 (ref 5–15)
BUN: 34 mg/dL — ABNORMAL HIGH (ref 8–23)
CO2: 23 mmol/L (ref 22–32)
Calcium: 9.6 mg/dL (ref 8.9–10.3)
Chloride: 103 mmol/L (ref 98–111)
Creatinine, Ser: 1.73 mg/dL — ABNORMAL HIGH (ref 0.61–1.24)
GFR, Estimated: 39 mL/min — ABNORMAL LOW
Glucose, Bld: 140 mg/dL — ABNORMAL HIGH (ref 70–99)
Potassium: 4.8 mmol/L (ref 3.5–5.1)
Sodium: 137 mmol/L (ref 135–145)

## 2024-07-13 LAB — TYPE AND SCREEN
ABO/RH(D): O POS
Antibody Screen: NEGATIVE

## 2024-07-13 LAB — GLUCOSE, CAPILLARY
Glucose-Capillary: 142 mg/dL — ABNORMAL HIGH (ref 70–99)
Glucose-Capillary: 138 mg/dL — ABNORMAL HIGH (ref 70–99)
Glucose-Capillary: 229 mg/dL — ABNORMAL HIGH (ref 70–99)

## 2024-07-13 MED ORDER — ONDANSETRON HCL 4 MG PO TABS: 4.0000 mg | ORAL_TABLET | Freq: Three times a day (TID) | ORAL | 0 refills | Status: AC | PRN

## 2024-07-13 MED ORDER — DEXAMETHASONE SOD PHOSPHATE PF 10 MG/ML IJ SOLN: 4.0000 mg | Freq: Once | INTRAMUSCULAR | Status: DC

## 2024-07-13 MED ORDER — AMLODIPINE BESYLATE 5 MG PO TABS
7.5000 mg | ORAL_TABLET | Freq: Every day | ORAL | Status: DC
  Administered 2024-07-14: 7.5 mg via ORAL
  Filled 2024-07-13: qty 2

## 2024-07-13 MED ORDER — PHENYLEPHRINE 80 MCG/ML (10ML) SYRINGE FOR IV PUSH (FOR BLOOD PRESSURE SUPPORT)
PREFILLED_SYRINGE | INTRAVENOUS | Status: DC | PRN
  Administered 2024-07-13: 80 ug via INTRAVENOUS

## 2024-07-13 MED ORDER — ATORVASTATIN CALCIUM 20 MG PO TABS
20.0000 mg | ORAL_TABLET | Freq: Every day | ORAL | Status: DC
  Administered 2024-07-13: 20 mg via ORAL
  Filled 2024-07-13: qty 1

## 2024-07-13 MED ORDER — POLYETHYLENE GLYCOL 3350 17 G PO PACK: 17.0000 g | PACK | Freq: Every day | ORAL | Status: DC | PRN

## 2024-07-13 MED ORDER — INSULIN ASPART 100 UNIT/ML IJ SOLN
0.0000 [IU] | Freq: Three times a day (TID) | INTRAMUSCULAR | Status: DC
  Administered 2024-07-14: 5 [IU] via SUBCUTANEOUS
  Administered 2024-07-14: 3 [IU] via SUBCUTANEOUS
  Filled 2024-07-13: qty 5
  Filled 2024-07-13: qty 3

## 2024-07-13 MED ORDER — DEXMEDETOMIDINE HCL IN NACL 80 MCG/20ML IV SOLN
INTRAVENOUS | Status: DC | PRN
  Administered 2024-07-13 (×3): 4 ug via INTRAVENOUS

## 2024-07-13 MED ORDER — ACETAMINOPHEN 500 MG PO TABS
1000.0000 mg | ORAL_TABLET | Freq: Four times a day (QID) | ORAL | Status: DC
  Administered 2024-07-13 – 2024-07-14 (×3): 1000 mg via ORAL
  Filled 2024-07-13 (×3): qty 2

## 2024-07-13 MED ORDER — FENTANYL CITRATE (PF) 50 MCG/ML IJ SOSY
50.0000 ug | PREFILLED_SYRINGE | INTRAMUSCULAR | Status: DC | PRN
  Administered 2024-07-13: 100 ug via INTRAVENOUS
  Filled 2024-07-13: qty 2

## 2024-07-13 MED ORDER — LACTATED RINGERS IV SOLN: INTRAVENOUS | Status: DC

## 2024-07-13 MED ORDER — ACETAMINOPHEN 500 MG PO TABS: 1000.0000 mg | ORAL_TABLET | Freq: Three times a day (TID) | ORAL | Status: AC | PRN

## 2024-07-13 MED ORDER — ISOPROPYL ALCOHOL 70 % SOLN
Status: DC | PRN
  Administered 2024-07-13: 1 via TOPICAL

## 2024-07-13 MED ORDER — POLYETHYLENE GLYCOL 3350 17 G PO PACK: 17.0000 g | PACK | Freq: Every day | ORAL | 0 refills | Status: AC

## 2024-07-13 MED ORDER — CHLORHEXIDINE GLUCONATE 0.12 % MT SOLN
15.0000 mL | Freq: Once | OROMUCOSAL | Status: AC
  Administered 2024-07-13: 15 mL via OROMUCOSAL

## 2024-07-13 MED ORDER — SODIUM CHLORIDE (PF) 0.9 % IJ SOLN
INTRAMUSCULAR | Status: AC
  Filled 2024-07-13: qty 30

## 2024-07-13 MED ORDER — ZOLPIDEM TARTRATE 5 MG PO TABS: 5.0000 mg | ORAL_TABLET | Freq: Every evening | ORAL | Status: DC | PRN

## 2024-07-13 MED ORDER — DOCUSATE SODIUM 100 MG PO CAPS
100.0000 mg | ORAL_CAPSULE | Freq: Two times a day (BID) | ORAL | Status: DC
  Administered 2024-07-13 – 2024-07-14 (×2): 100 mg via ORAL
  Filled 2024-07-13 (×2): qty 1

## 2024-07-13 MED ORDER — PHENOL 1.4 % MT LIQD: 1.0000 | OROMUCOSAL | Status: DC | PRN

## 2024-07-13 MED ORDER — TAMSULOSIN HCL 0.4 MG PO CAPS: 0.4000 mg | ORAL_CAPSULE | Freq: Every evening | ORAL | Status: DC

## 2024-07-13 MED ORDER — BUPIVACAINE LIPOSOME 1.3 % IJ SUSP: 20.0000 mL | Freq: Once | INTRAMUSCULAR | Status: DC

## 2024-07-13 MED ORDER — DEXAMETHASONE SOD PHOSPHATE PF 10 MG/ML IJ SOLN
INTRAMUSCULAR | Status: DC | PRN
  Administered 2024-07-13: 8 mg via INTRAVENOUS

## 2024-07-13 MED ORDER — ONDANSETRON HCL 4 MG PO TABS: 4.0000 mg | ORAL_TABLET | Freq: Four times a day (QID) | ORAL | Status: DC | PRN

## 2024-07-13 MED ORDER — SODIUM CHLORIDE 0.9 % IR SOLN
Status: DC | PRN
  Administered 2024-07-13: 3000 mL

## 2024-07-13 MED ORDER — EMPAGLIFLOZIN 25 MG PO TABS
25.0000 mg | ORAL_TABLET | Freq: Every day | ORAL | Status: DC
  Administered 2024-07-14: 25 mg via ORAL
  Filled 2024-07-13: qty 1

## 2024-07-13 MED ORDER — IRBESARTAN 75 MG PO TABS
75.0000 mg | ORAL_TABLET | Freq: Every day | ORAL | Status: DC
  Filled 2024-07-13: qty 1

## 2024-07-13 MED ORDER — PROPOFOL 10 MG/ML IV BOLUS
INTRAVENOUS | Status: DC | PRN
  Administered 2024-07-13: 95 ug/kg/min via INTRAVENOUS
  Administered 2024-07-13: 50 mg via INTRAVENOUS

## 2024-07-13 MED ORDER — MENTHOL 3 MG MT LOZG: 1.0000 | LOZENGE | OROMUCOSAL | Status: DC | PRN

## 2024-07-13 MED ORDER — METHOCARBAMOL 500 MG PO TABS: 500.0000 mg | ORAL_TABLET | Freq: Three times a day (TID) | ORAL | 0 refills | Status: AC | PRN

## 2024-07-13 MED ORDER — OXYCODONE HCL 5 MG PO TABS
5.0000 mg | ORAL_TABLET | ORAL | Status: DC | PRN
  Administered 2024-07-13 – 2024-07-14 (×4): 5 mg via ORAL
  Filled 2024-07-13 (×4): qty 1

## 2024-07-13 MED ORDER — GLIPIZIDE ER 5 MG PO TB24
10.0000 mg | ORAL_TABLET | Freq: Every day | ORAL | Status: DC
  Administered 2024-07-14: 10 mg via ORAL
  Filled 2024-07-13: qty 2

## 2024-07-13 MED ORDER — NITROGLYCERIN 0.4 MG SL SUBL: 0.4000 mg | SUBLINGUAL_TABLET | SUBLINGUAL | Status: DC | PRN

## 2024-07-13 MED ORDER — POLYVINYL ALCOHOL 1.4 % OP SOLN: 1.0000 [drp] | Freq: Three times a day (TID) | OPHTHALMIC | Status: DC | PRN

## 2024-07-13 MED ORDER — ACETAMINOPHEN 500 MG PO TABS
1000.0000 mg | ORAL_TABLET | Freq: Once | ORAL | Status: AC
  Administered 2024-07-13: 1000 mg via ORAL
  Filled 2024-07-13: qty 2

## 2024-07-13 MED ORDER — METHOCARBAMOL 1000 MG/10ML IJ SOLN: 500.0000 mg | Freq: Four times a day (QID) | INTRAMUSCULAR | Status: DC | PRN

## 2024-07-13 MED ORDER — WATER FOR IRRIGATION, STERILE IR SOLN
Status: DC | PRN
  Administered 2024-07-13: 2000 mL

## 2024-07-13 MED ORDER — ONDANSETRON HCL 4 MG/2ML IJ SOLN
INTRAMUSCULAR | Status: AC
  Filled 2024-07-13: qty 2

## 2024-07-13 MED ORDER — BUPIVACAINE-EPINEPHRINE (PF) 0.5% -1:200000 IJ SOLN
INTRAMUSCULAR | Status: DC | PRN
  Administered 2024-07-13: 20 mL via PERINEURAL

## 2024-07-13 MED ORDER — DEXAMETHASONE SOD PHOSPHATE PF 10 MG/ML IJ SOLN
INTRAMUSCULAR | Status: AC
  Filled 2024-07-13: qty 1

## 2024-07-13 MED ORDER — BUPIVACAINE LIPOSOME 1.3 % IJ SUSP
INTRAMUSCULAR | Status: AC
  Filled 2024-07-13: qty 20

## 2024-07-13 MED ORDER — OXYCODONE HCL 5 MG PO TABS: 5.0000 mg | ORAL_TABLET | ORAL | 0 refills | Status: AC | PRN

## 2024-07-13 MED ORDER — TRANEXAMIC ACID-NACL 1000-0.7 MG/100ML-% IV SOLN
INTRAVENOUS | Status: DC | PRN
  Administered 2024-07-13: 1000 mg via INTRAVENOUS

## 2024-07-13 MED ORDER — PROPOFOL 1000 MG/100ML IV EMUL
INTRAVENOUS | Status: AC
  Filled 2024-07-13: qty 100

## 2024-07-13 MED ORDER — LIDOCAINE HCL (PF) 2 % IJ SOLN
INTRAMUSCULAR | Status: AC
  Filled 2024-07-13: qty 5

## 2024-07-13 MED ORDER — INSULIN ASPART 100 UNIT/ML IJ SOLN: 0.0000 [IU] | INTRAMUSCULAR | Status: DC | PRN

## 2024-07-13 MED ORDER — CARVEDILOL 6.25 MG PO TABS
6.2500 mg | ORAL_TABLET | Freq: Every day | ORAL | Status: DC
  Filled 2024-07-13: qty 1

## 2024-07-13 MED ORDER — HYDROMORPHONE HCL 1 MG/ML IJ SOLN: 0.5000 mg | INTRAMUSCULAR | Status: DC | PRN

## 2024-07-13 MED ORDER — SUGAMMADEX SODIUM 200 MG/2ML IV SOLN
INTRAVENOUS | Status: AC
  Filled 2024-07-13: qty 2

## 2024-07-13 MED ORDER — PHENYLEPHRINE 80 MCG/ML (10ML) SYRINGE FOR IV PUSH (FOR BLOOD PRESSURE SUPPORT)
PREFILLED_SYRINGE | INTRAVENOUS | Status: AC
  Filled 2024-07-13: qty 10

## 2024-07-13 MED ORDER — DIPHENHYDRAMINE HCL 12.5 MG/5ML PO ELIX: 12.5000 mg | ORAL_SOLUTION | ORAL | Status: DC | PRN

## 2024-07-13 MED ORDER — APIXABAN 2.5 MG PO TABS
2.5000 mg | ORAL_TABLET | Freq: Two times a day (BID) | ORAL | Status: DC
  Administered 2024-07-14: 2.5 mg via ORAL
  Filled 2024-07-13: qty 1

## 2024-07-13 MED ORDER — ACETAMINOPHEN 325 MG PO TABS: 325.0000 mg | ORAL_TABLET | Freq: Four times a day (QID) | ORAL | Status: DC | PRN

## 2024-07-13 MED ORDER — TRANEXAMIC ACID-NACL 1000-0.7 MG/100ML-% IV SOLN
1000.0000 mg | INTRAVENOUS | Status: DC
  Filled 2024-07-13: qty 100

## 2024-07-13 MED ORDER — EZETIMIBE 10 MG PO TABS
10.0000 mg | ORAL_TABLET | Freq: Every day | ORAL | Status: DC
  Administered 2024-07-14: 10 mg via ORAL
  Filled 2024-07-13: qty 1

## 2024-07-13 MED ORDER — ONDANSETRON HCL 4 MG/2ML IJ SOLN: 4.0000 mg | Freq: Four times a day (QID) | INTRAMUSCULAR | Status: DC | PRN

## 2024-07-13 MED ORDER — METHOCARBAMOL 500 MG PO TABS
500.0000 mg | ORAL_TABLET | Freq: Four times a day (QID) | ORAL | Status: DC | PRN
  Administered 2024-07-13 – 2024-07-14 (×2): 500 mg via ORAL
  Filled 2024-07-13 (×2): qty 1

## 2024-07-13 MED ORDER — SODIUM CHLORIDE 0.9 % IV SOLN: INTRAVENOUS | Status: DC

## 2024-07-13 MED ORDER — ISOPROPYL ALCOHOL 70 % SOLN
Status: AC
  Filled 2024-07-13: qty 480

## 2024-07-13 MED ORDER — CEFAZOLIN SODIUM-DEXTROSE 2-4 GM/100ML-% IV SOLN
2.0000 g | Freq: Four times a day (QID) | INTRAVENOUS | Status: AC
  Administered 2024-07-13 – 2024-07-14 (×2): 2 g via INTRAVENOUS
  Filled 2024-07-13 (×2): qty 100

## 2024-07-13 MED ORDER — BUPIVACAINE IN DEXTROSE 0.75-8.25 % IT SOLN
INTRATHECAL | Status: DC | PRN
  Administered 2024-07-13: 2 mL via INTRATHECAL

## 2024-07-13 MED ORDER — BUPIVACAINE-EPINEPHRINE (PF) 0.25% -1:200000 IJ SOLN
INTRAMUSCULAR | Status: AC
  Filled 2024-07-13: qty 30

## 2024-07-13 MED ORDER — ORAL CARE MOUTH RINSE: 15.0000 mL | Freq: Once | OROMUCOSAL | Status: AC

## 2024-07-13 MED ORDER — HYDROMORPHONE HCL 2 MG/ML IJ SOLN
INTRAMUSCULAR | Status: AC
  Filled 2024-07-13: qty 1

## 2024-07-13 MED ORDER — FENTANYL CITRATE (PF) 50 MCG/ML IJ SOSY: 25.0000 ug | PREFILLED_SYRINGE | INTRAMUSCULAR | Status: DC | PRN

## 2024-07-13 MED ORDER — INSULIN GLARGINE-YFGN 100 UNIT/ML ~~LOC~~ SOLN
17.0000 [IU] | Freq: Every day | SUBCUTANEOUS | Status: DC
  Administered 2024-07-13: 17 [IU] via SUBCUTANEOUS
  Filled 2024-07-13 (×2): qty 0.17

## 2024-07-13 MED ORDER — MIDAZOLAM HCL (PF) 2 MG/2ML IJ SOLN: 1.0000 mg | INTRAMUSCULAR | Status: DC | PRN

## 2024-07-13 MED ORDER — 0.9 % SODIUM CHLORIDE (POUR BTL) OPTIME
TOPICAL | Status: DC | PRN
  Administered 2024-07-13: 1000 mL

## 2024-07-13 MED ORDER — PANTOPRAZOLE SODIUM 40 MG PO TBEC
40.0000 mg | DELAYED_RELEASE_TABLET | Freq: Every day | ORAL | Status: DC
  Administered 2024-07-14: 40 mg via ORAL
  Filled 2024-07-13: qty 1

## 2024-07-13 MED ORDER — CEFAZOLIN SODIUM-DEXTROSE 2-4 GM/100ML-% IV SOLN
2.0000 g | INTRAVENOUS | Status: AC
  Administered 2024-07-13: 2 g via INTRAVENOUS
  Filled 2024-07-13: qty 100

## 2024-07-13 MED ORDER — POVIDONE-IODINE 10 % EX SWAB
2.0000 | Freq: Once | CUTANEOUS | Status: AC
  Administered 2024-07-13: 2 via TOPICAL

## 2024-07-13 MED ORDER — ONDANSETRON HCL 4 MG/2ML IJ SOLN
INTRAMUSCULAR | Status: DC | PRN
  Administered 2024-07-13: 4 mg via INTRAVENOUS

## 2024-07-13 MED ORDER — OMEPRAZOLE 40 MG PO CPDR: 40.0000 mg | DELAYED_RELEASE_CAPSULE | Freq: Every day | ORAL | 0 refills | Status: AC

## 2024-07-13 MED ORDER — LACTATED RINGERS IV SOLN: INTRAVENOUS | Status: DC | PRN

## 2024-07-13 NOTE — Interval H&P Note (Signed)

## 2024-07-13 NOTE — Anesthesia Procedure Notes (Signed)
 Spinal  Patient location during procedure: OR Start time: 07/13/2024 1:37 PM End time: 07/13/2024 1:42 PM Reason for block: surgical anesthesia  Staffing Performed: anesthesiologist  Authorized by: Epifanio Fallow, MD   Performed by: Epifanio Fallow, MD  Preanesthetic Checklist Completed: patient identified, IV checked, risks and benefits discussed, surgical consent, monitors and equipment checked, pre-op evaluation and timeout performed Spinal Block Patient position: sitting Prep: DuraPrep Patient monitoring: cardiac monitor, continuous pulse ox and blood pressure Approach: midline Location: L3-4 Injection technique: single-shot Needle Needle type: Pencan  Needle gauge: 24 G Needle length: 9 cm Assessment Sensory level: T8 Events: CSF return  Additional Notes Functioning IV was confirmed and monitors were applied. Sterile prep and drape, including hand hygiene and sterile gloves were used. The patient was positioned and the spine was prepped. The skin was anesthetized with lidocaine .  Free flow of clear CSF was obtained prior to injecting local anesthetic into the CSF.  The spinal needle aspirated freely following injection.  The needle was carefully withdrawn.  The patient tolerated the procedure well.

## 2024-07-13 NOTE — Anesthesia Procedure Notes (Signed)
 Anesthesia Regional Block: Adductor canal block   Pre-Anesthetic Checklist: , timeout performed,  Correct Patient, Correct Site, Correct Laterality,  Correct Procedure, Correct Position, site marked,  Risks and benefits discussed,  Pre-op evaluation,  At surgeon's request and post-op pain management  Laterality: Left  Prep: Maximum Sterile Barrier Precautions used, chloraprep       Needles:  Injection technique: Single-shot  Needle Type: Echogenic Stimulator Needle     Needle Length: 9cm  Needle Gauge: 21     Additional Needles:   Procedures:,,,, ultrasound used (permanent image in chart),,    Narrative:  Start time: 07/13/2024 12:30 PM End time: 07/13/2024 12:40 PM Injection made incrementally with aspirations every 5 mL.  Performed by: Personally  Anesthesiologist: Epifanio Fallow, MD

## 2024-07-13 NOTE — Discharge Instructions (Signed)
 INSTRUCTIONS AFTER JOINT REPLACEMENT   Remove items at home which could result in a fall. This includes throw rugs or furniture in walking pathways ICE to the affected joint every three hours while awake for 30 minutes at a time, for at least the first 3-5 days, and then as needed for pain and swelling.  Continue to use ice for pain and swelling. You may notice swelling that will progress down to the foot and ankle.  This is normal after surgery.  Elevate your leg when you are not up walking on it.   Continue to use the breathing machine you got in the hospital (incentive spirometer) which will help keep your temperature down.  It is common for your temperature to cycle up and down following surgery, especially at night when you are not up moving around and exerting yourself.  The breathing machine keeps your lungs expanded and your temperature down.  DIET:  As you were doing prior to hospitalization, we recommend a well-balanced diet.  DRESSING / WOUND CARE / SHOWERING:  Keep the surgical dressing until follow up.  The dressing is water  proof, so you can shower without any extra covering.  IF THE DRESSING FALLS OFF or the wound gets wet inside, change the dressing with sterile gauze.  Please use good hand washing techniques before changing the dressing.  Do not use any lotions or creams on the incision until instructed by your surgeon.    ACTIVITY  Increase activity slowly as tolerated, but follow the weight bearing instructions below.   No driving for 6 weeks or until further direction given by your physician.  You cannot drive while taking narcotics.  No lifting or carrying greater than 10 lbs. until further directed by your surgeon. Avoid periods of inactivity such as sitting longer than an hour when not asleep. This helps prevent blood clots.  You may return to work once you are authorized by your doctor.   WEIGHT BEARING: Weight bearing as tolerated with assist device (walker, cane, etc) as  directed, use it as long as suggested by your surgeon or therapist, typically at least 4-6 weeks.  EXERCISES  Results after joint replacement surgery are often greatly improved when you follow the exercise, range of motion and muscle strengthening exercises prescribed by your doctor. Safety measures are also important to protect the joint from further injury. Any time any of these exercises cause you to have increased pain or swelling, decrease what you are doing until you are comfortable again and then slowly increase them. If you have problems or questions, call your caregiver or physical therapist for advice.   Rehabilitation is important following a joint replacement. After just a few days of immobilization, the muscles of the leg can become weakened and shrink (atrophy).  These exercises are designed to build up the tone and strength of the thigh and leg muscles and to improve motion. Often times heat used for twenty to thirty minutes before working out will loosen up your tissues and help with improving the range of motion but do not use heat for the first two weeks following surgery (sometimes heat can increase post-operative swelling).   These exercises can be done on a training (exercise) mat, on the floor, on a table or on a bed. Use whatever works the best and is most comfortable for you.    Use music or television while you are exercising so that the exercises are a pleasant break in your day. This will make your life  better with the exercises acting as a break in your routine that you can look forward to.   Perform all exercises about fifteen times, three times per day or as directed.  You should exercise both the operative leg and the other leg as well.  Exercises include:   Quad Sets - Tighten up the muscle on the front of the thigh (Quad) and hold for 5-10 seconds.   Straight Leg Raises - With your knee straight (if you were given a brace, keep it on), lift the leg to 60 degrees, hold  for 3 seconds, and slowly lower the leg.  Perform this exercise against resistance later as your leg gets stronger.  Leg Slides: Lying on your back, slowly slide your foot toward your buttocks, bending your knee up off the floor (only go as far as is comfortable). Then slowly slide your foot back down until your leg is flat on the floor again.  Angel Wings: Lying on your back spread your legs to the side as far apart as you can without causing discomfort.  Hamstring Strength:  Lying on your back, push your heel against the floor with your leg straight by tightening up the muscles of your buttocks.  Repeat, but this time bend your knee to a comfortable angle, and push your heel against the floor.  You may put a pillow under the heel to make it more comfortable if necessary.   A rehabilitation program following joint replacement surgery can speed recovery and prevent re-injury in the future due to weakened muscles. Contact your doctor or a physical therapist for more information on knee rehabilitation.   CONSTIPATION:  Constipation is defined medically as fewer than three stools per week and severe constipation as less than one stool per week.  Even if you have a regular bowel pattern at home, your normal regimen is likely to be disrupted due to multiple reasons following surgery.  Combination of anesthesia, postoperative narcotics, change in appetite and fluid intake all can affect your bowels.   YOU MUST use at least one of the following options; they are listed in order of increasing strength to get the job done.  They are all available over the counter, and you may need to use some, POSSIBLY even all of these options:    Drink plenty of fluids (prune juice may be helpful) and high fiber foods Colace 100 mg by mouth twice a day  Senokot for constipation as directed and as needed Dulcolax (bisacodyl), take with full glass of water   Miralax  (polyethylene glycol) once or twice a day as needed.  If you  have tried all these things and are unable to have a bowel movement in the first 3-4 days after surgery call either your surgeon or your primary doctor.    If you experience loose stools or diarrhea, hold the medications until you stool forms back up.  If your symptoms do not get better within 1 week or if they get worse, check with your doctor.  If you experience the worst abdominal pain ever or develop nausea or vomiting, please contact the office immediately for further recommendations for treatment.  ITCHING:  If you experience itching with your medications, try taking only a single pain pill, or even half a pain pill at a time.  You can also use Benadryl  over the counter for itching or also to help with sleep.   TED HOSE STOCKINGS:  Use stockings on both legs until for at least 2 weeks or  as directed by physician office. They may be removed at night for sleeping.  MEDICATIONS:  See your medication summary on the After Visit Summary that nursing will review with you.  You may have some home medications which will be placed on hold until you complete the course of blood thinner medication.  It is important for you to complete the blood thinner medication as prescribed.  Blood clot prevention (DVT Prophylaxis): After surgery you are at an increased risk for a blood clot.  You are to resume your home eliquis  2.5mg , to be taken twice daily for a total of 4 weeks from surgery to help reduce your risk of getting a blood clot.  Then resume how your regular prescriber has instructed.  Signs of a pulmonary embolus (blood clot in the lungs) include sudden short of breath, feeling lightheaded or dizzy, chest pain with a deep breath, rapid pulse rapid breathing.  Signs of a blood clot in your arms or legs include new unexplained swelling and cramping, warm, red or darkened skin around the painful area.  Please call the office or 911 right away if these signs or symptoms develop.  PRECAUTIONS:   If you  experience chest pain or shortness of breath - call 911 immediately for transfer to the hospital emergency department.   If you develop a fever greater that 101 F, purulent drainage from wound, increased redness or drainage from wound, foul odor from the wound/dressing, or calf pain - CONTACT YOUR SURGEON.                                                   FOLLOW-UP APPOINTMENTS:  If you do not already have a post-op appointment, please call the office for an appointment to be seen by your surgeon.  Guidelines for how soon to be seen are listed in your After Visit Summary, but are typically between 2-3 weeks after surgery.  If you have a specialized bandage, you may be told to follow up 1 week after surgery.  OTHER INSTRUCTIONS:  Knee Replacement:  Do not place pillow under knee, focus on keeping the knee straight while resting.  Place foam block, curve side up under heel at all times except when walking.  DO NOT modify, tear, cut, or change the foam block in any way.  POST-OPERATIVE OPIOID TAPER INSTRUCTIONS: It is important to wean off of your opioid medication as soon as possible. If you do not need pain medication after your surgery it is ok to stop day one. Opioids include: Codeine, Hydrocodone (Norco, Vicodin), Oxycodone (Percocet, oxycontin ) and hydromorphone  amongst others.  Long term and even short term use of opiods can cause: Increased pain response Dependence Constipation Depression Respiratory depression And more.  Withdrawal symptoms can include Flu like symptoms Nausea, vomiting And more Techniques to manage these symptoms Hydrate well Eat regular healthy meals Stay active Use relaxation techniques(deep breathing, meditating, yoga) Do Not substitute Alcohol  to help with tapering If you have been on opioids for less than two weeks and do not have pain than it is ok to stop all together.  Plan to wean off of opioids This plan should start within one week post op of your  joint replacement. Maintain the same interval or time between taking each dose and first decrease the dose.  Cut the total daily intake of opioids by one tablet  each day Next start to increase the time between doses. The last dose that should be eliminated is the evening dose.   MAKE SURE YOU:  Understand these instructions.  Get help right away if you are not doing well or get worse.    Thank you for letting us  be a part of your medical care team.  It is a privilege we respect greatly.  We hope these instructions will help you stay on track for a fast and full recovery!

## 2024-07-13 NOTE — Anesthesia Postprocedure Evaluation (Signed)
"   Anesthesia Post Note  Patient: ELIAZ FOUT  Procedure(s) Performed: ARTHROPLASTY, KNEE, TOTAL (Left: Knee)     Patient location during evaluation: PACU Anesthesia Type: Spinal and Regional Level of consciousness: oriented and awake and alert Pain management: pain level controlled Vital Signs Assessment: post-procedure vital signs reviewed and stable Respiratory status: spontaneous breathing and respiratory function stable Cardiovascular status: blood pressure returned to baseline and stable Postop Assessment: no headache, no backache, no apparent nausea or vomiting, spinal receding and patient able to bend at knees Anesthetic complications: no   No notable events documented.  Last Vitals:  Vitals:   07/13/24 1700 07/13/24 1715  BP: 137/70 132/73  Pulse: 64 63  Resp: 15 13  Temp:  (!) 36.1 C  SpO2: 94% 93%    Last Pain:  Vitals:   07/13/24 1700  TempSrc:   PainSc: 0-No pain                 Scarlett Portlock,W. EDMOND      "

## 2024-07-13 NOTE — Anesthesia Procedure Notes (Signed)
 Procedure Name: MAC Date/Time: 07/13/2024 1:42 PM  Performed by: Obadiah Reyes BROCKS, CRNAPre-anesthesia Checklist: Patient identified, Emergency Drugs available, Suction available, Patient being monitored and Timeout performed Patient Re-evaluated:Patient Re-evaluated prior to induction Oxygen Delivery Method: Simple face mask Preoxygenation: Pre-oxygenation with 100% oxygen Induction Type: IV induction Airway Equipment and Method: Oral airway

## 2024-07-13 NOTE — Progress Notes (Signed)
 Orthopedic Tech Progress Note Patient Details:  Steven Dixon 19-Jun-1941 [id number]  Ortho Devices Type of Ortho Device: Bone foam zero knee Ortho Device/Splint Location: left Ortho Device/Splint Interventions: Ordered, Application, Adjustment   Post Interventions Patient Tolerated: Well Instructions Provided: Adjustment of device, Care of device  [name] 07/13/2024, 5:53 PM

## 2024-07-13 NOTE — Transfer of Care (Signed)
 Immediate Anesthesia Transfer of Care Note  Patient: Steven Dixon  Procedure(s) Performed: ARTHROPLASTY, KNEE, TOTAL (Left: Knee)  Patient Location: PACU  Anesthesia Type:MAC, Regional, and Spinal  Level of Consciousness: awake, alert , and oriented  Airway & Oxygen Therapy: Patient Spontanous Breathing and Patient connected to face mask oxygen  Post-op Assessment: Report given to RN and Post -op Vital signs reviewed and unstable, Anesthesiologist notified  Post vital signs: Reviewed and stable  Last Vitals:  Vitals Value Taken Time  BP 128/69 07/13/24 15:50  Temp    Pulse 65 07/13/24 15:52  Resp 16 07/13/24 15:52  SpO2 97 % 07/13/24 15:52  Vitals shown include unfiled device data.  Last Pain:  Vitals:   07/13/24 1120  TempSrc:   PainSc: 0-No pain      Patients Stated Pain Goal: 4 (07/13/24 1111)  Complications: No notable events documented.

## 2024-07-13 NOTE — Op Note (Signed)
 DATE OF SURGERY:  07/13/2024 TIME: 3:09 PM  PATIENT NAME:  Steven Dixon   AGE: 84 y.o.    PRE-OPERATIVE DIAGNOSIS: End-stage left knee osteoarthritis  POST-OPERATIVE DIAGNOSIS:  Same  PROCEDURE: Cemented left total Knee Arthroplasty  SURGEON:  Gaynelle Pastrana A Jonda Alanis, MD   ASSISTANT: Bernarda Mclean, PA-C, present and scrubbed throughout the case, critical for assistance with exposure, retraction, instrumentation, and closure.   OPERATIVE IMPLANTS:  Cemented Zimmer persona size 10 standard CR femur, G left tibial baseplate, 35 mm cemented patella, 11 mm MC poly insert Implant Name Type Inv. Item Serial No. Manufacturer Lot No. LRB No. Used Action  CEMENT BONE REFOBACIN R1X40 US  - [id number] Cement CEMENT BONE REFOBACIN R1X40 US   ZIMMER [employer]ON(ORTH,TRAU,BIO,SG) [id number] Left 1 Implanted  CEMENT BONE REFOBACIN R1X40 US  - [id number] Cement CEMENT BONE REFOBACIN R1X40 US   [employer]IMMER [employer]ON(ORTH,TRAU,BIO,SG) [id number] Left 1 Implanted  STEM TIBIA 5 DEG SZ G L KNEE - [id number] Knees STEM TIBIA 5 DEG SZ G L KNEE  ZIMMER [employer]ON(ORTH,TRAU,BIO,SG) [id number] Left 1 Implanted  SURFACE ARTC PRSNA CCR SZ10 L - [id number] Joint SURFACE ARTC PRSNA CCR SZ10 L  ZIMMER [employer]ON(ORTH,TRAU,BIO,SG) [id number] Left 1 Implanted  STEM POLY PAT PLY 61M KNEE - [id number] Knees STEM POLY PAT PLY 61M KNEE  ZIMMER [employer]ON(ORTH,TRAU,BIO,SG) [id number] Left 1 Implanted  INSERT COMP PS 8-11 GH LT - [id number] Insert INSERT COMP PS 8-11 GH LT  ZIMMER [employer]ON(ORTH,TRAU,BIO,SG) [id number] Left 1 Implanted      PREOPERATIVE INDICATIONS:  Steven Dixon is a 84 y.o. year old male with end stage bone on bone degenerative arthritis of the knee who failed conservative treatment, including injections, antiinflammatories, activity modification, and assistive devices, and had significant impairment of their activities of daily living, and elected for Total Knee Arthroplasty.   The risks, benefits, and alternatives were discussed at length  including but not limited to the risks of infection, bleeding, nerve injury, stiffness, blood clots, the need for revision surgery, cardiopulmonary complications, among others, and they were willing to proceed.   ESTIMATED BLOOD LOSS: 50cc  OPERATIVE DESCRIPTION:   Once adequate anesthesia was induced, preoperative antibiotics, 2 gm of ancef ,1 gm of Tranexamic Acid , and 8 mg of Decadron  administered, the patient was positioned supine with a left thigh tourniquet placed.  The left lower extremity was prepped and draped in sterile fashion.  A time-  out was performed identifying the patient, planned procedure, and the appropriate extremity.     The leg was  exsanguinated, tourniquet elevated to 250 mmHg.  A midline incision was  made followed by median parapatellar arthrotomy. Anterior horn of the medial meniscus was released and resected. A medial release was performed, the infrapatellar fat pad was resected with care taken to protect the patellar tendon. The suprapatellar fat was removed to exposed the distal anterior femur. The anterior horn of the lateral meniscus and ACL were released.    Following initial  exposure, I first started with the femur  The femoral  canal was opened with a drill, canal was suctioned to try to prevent fat emboli.  An  intramedullary rod was passed set at 5 degrees valgus, 10 mm. The distal femur was resected.  Following this resection, the tibia was  subluxated anteriorly.  Using the extramedullary guide, 10 mm of bone was resected off   the proximal lateral tibia.  We confirmed the gap would be  stable medially and laterally with a size 10mm spacer block as well as confirmed that the tibial cut was  perpendicular in the coronal plane, checking with an alignment rod.    Once this was done, the posterior femoral referencing femoral sizer was placed under to the posterior condyles with 3 degrees of external rotational which was parallel to the transepicondylar axis and  perpendicular to [employer]. The femur was sized to be a size 10 in the anterior-  posterior dimension. The  anterior, posterior, and  chamfer cuts were made without difficulty nor   notching making certain that I was along the anterior cortex to help  with flexion gap stability. Next a laminar spreader was placed with the knee in flexion and the medial lateral menisci were resected.  5 cc of the Exparel  mixture was injected in the medial side of the back of the knee and 3 cc in the lateral side.  1/2 inch curved osteotome was used to resect posterior osteophyte that was then removed with a pituitary rongeur.       At this point, the tibia was sized to be a size G.  The size G tray was  then pinned in position. Trial reduction was now carried with a 10 femur, G tibia, a 10 mm MC insert.  The knee had full extension and was stable to varus valgus stress in extension.  The knee was slightly tight in flexion and the PCL was partially released.   Attention was next directed to the patella.  Precut  measurement was noted to be 25 mm.  I resected down to 15 mm and used a  35mm patellar button to restore patellar height as well as cover the cut surface.     The patella lug holes were drilled and a 35 mm patella poly trial was placed.    The knee was brought to full extension with good flexion stability with the patella tracking through the trochlea without application of pressure.     Next the femoral component was again assessed and determined to be seated and appropriately lateralized.  The femoral lug holes were drilled.  The femoral component was then removed. Tibial component was again assessed and felt to be seated and appropriately rotated with the medial third of the tubercle. The tibia was then drilled, and keel punched.     Final components were  opened and antibiotic cement was mixed.      Final implants were then  cemented onto cleaned and dried cut surfaces of bone with the knee brought  to extension with a 10mm MC poly.  The knee was irrigated with sterile Betadine  diluted in saline as well as pulse lavage normal saline. The synovial lining was  then injected a dilute Exparel  with 30cc of 0.25% marcaine  with epinephrine .         Once the cement had fully cured, excess cement was removed throughout the knee.  I confirmed that I was satisfied with the range of motion and stability, and the final 11mm MC poly insert was chosen.  It was placed into the knee.         The tourniquet had been let down at 57 minutes.  No significant hemostasis was required.  The medial parapatellar arthrotomy was then reapproximated using #1 Stratafix sutures with the knee  in flexion.  The remaining wound was closed with 0 stratafix, 2-0 Vicryl, and running 3-0 Monocryl. The knee was cleaned, dried, dressed sterilely using Dermabond and   Aquacel dressing.  The patient was then brought to recovery room in stable condition, tolerating the procedure  well.  There were no complications.   Post op recs: WB: WBAT Abx: ancef  Imaging: PACU xrays DVT prophylaxis: Resume Eliquis  2.5 mg twice daily starting postop day 1 and Eliquis  5 mg postop day 3 Follow up: 2 weeks after surgery for a wound check with Dr. Edna at [facility].  Address: [address], [place], [place] [address]  Office Phone: [phone]  Toribio Edna, MD Orthopaedic Surgery

## 2024-07-14 DIAGNOSIS — M1712 Unilateral primary osteoarthritis, left knee: Secondary | ICD-10-CM | POA: Diagnosis not present

## 2024-07-14 LAB — CBC
HCT: 40.3 % (ref 39.0–52.0)
Hemoglobin: 13.3 g/dL (ref 13.0–17.0)
MCH: 28.8 pg (ref 26.0–34.0)
MCHC: 33 g/dL (ref 30.0–36.0)
MCV: 87.2 fL (ref 80.0–100.0)
Platelets: 251 K/uL (ref 150–400)
RBC: 4.62 MIL/uL (ref 4.22–5.81)
RDW: 13.8 % (ref 11.5–15.5)
WBC: 10.3 K/uL (ref 4.0–10.5)
nRBC: 0 % (ref 0.0–0.2)

## 2024-07-14 LAB — BASIC METABOLIC PANEL WITH GFR
Anion gap: 14 (ref 5–15)
BUN: 31 mg/dL — ABNORMAL HIGH (ref 8–23)
CO2: 21 mmol/L — ABNORMAL LOW (ref 22–32)
Calcium: 9.1 mg/dL (ref 8.9–10.3)
Chloride: 99 mmol/L (ref 98–111)
Creatinine, Ser: 1.86 mg/dL — ABNORMAL HIGH (ref 0.61–1.24)
GFR, Estimated: 36 mL/min — ABNORMAL LOW
Glucose, Bld: 250 mg/dL — ABNORMAL HIGH (ref 70–99)
Potassium: 4.6 mmol/L (ref 3.5–5.1)
Sodium: 133 mmol/L — ABNORMAL LOW (ref 135–145)

## 2024-07-14 LAB — GLUCOSE, CAPILLARY
Glucose-Capillary: 184 mg/dL — ABNORMAL HIGH (ref 70–99)
Glucose-Capillary: 218 mg/dL — ABNORMAL HIGH (ref 70–99)

## 2024-07-14 NOTE — Progress Notes (Signed)
" ° ° ° °  Subjective:  Patient reports pain as mild.  He is pleased with his progress so far.  Discussed going better than his previous knee done many years ago.  Plan to mobilize with therapy today.  Hopeful for discharge home later today.  Yesterday's total administered Morphine Milligram Equivalents: 37.5   Objective:   VITALS:   Vitals:   07/13/24 1740 07/13/24 2213 07/14/24 0148 07/14/24 0522  BP: (!) 150/75 130/68 134/65 131/61  Pulse: 66 62 (!) 58 (!) 54  Resp: 18 16 16 14   Temp: (!) 97.5 F (36.4 C) 97.7 F (36.5 C) 98.4 F (36.9 C) 97.6 F (36.4 C)  TempSrc: Oral Oral Oral Oral  SpO2: 98% 93% 95% 95%  Weight:      Height:        Sensation intact distally Intact pulses distally Dorsiflexion/Plantar flexion intact Incision: dressing C/D/I Compartment soft    Lab Results  Component Value Date   WBC 10.3 07/14/2024   HGB 13.3 07/14/2024   HCT 40.3 07/14/2024   MCV 87.2 07/14/2024   PLT 251 07/14/2024   BMET    Component Value Date/Time   NA 133 (L) 07/14/2024 0339   NA 138 09/16/2021 1036   K 4.6 07/14/2024 0339   CL 99 07/14/2024 0339   CO2 21 (L) 07/14/2024 0339   GLUCOSE 250 (H) 07/14/2024 0339   BUN 31 (H) 07/14/2024 0339   BUN 19 09/16/2021 1036   CREATININE 1.86 (H) 07/14/2024 0339   CALCIUM  9.1 07/14/2024 0339   EGFR 49 (L) 09/16/2021 1036   GFRNONAA 36 (L) 07/14/2024 0339      Xray: Total knee arthroplasty components in good position no adverse features  Assessment/Plan: 1 Day Post-Op   Principal Problem:   Primary osteoarthritis of left knee  S/p L TKA 07/14/23  Post op recs: WB: WBAT Abx: ancef  Imaging: PACU xrays DVT prophylaxis: Resume Eliquis  2.5 mg twice daily starting postop day 1 and Eliquis  5 mg postop day 3 Follow up: 2 weeks after surgery for a wound check with Dr. Edna at [facility].  Address: [address], [place], [place] [address]  Office Phone: [phone]      TORIBIO DELENA EDNA 07/14/2024, 6:38 AM   Toribio Edna, MD  Contact information:   [phone] 7am-5pm epic message Dr. Edna, or call office for patient follow up: [phone] After hours and holidays please check [email or web address] for group call information for [facility]   "

## 2024-07-14 NOTE — Progress Notes (Signed)
 Physical Therapy Treatment Patient Details Name: Steven Dixon MRN: [id number] DOB: 1940/12/29 Today's Date: 07/14/2024   History of Present Illness Pt is an 84 year old male s/p L TKA on 07/13/24.  PMHx: afib, arthritis, bradycardia, CKD III, CAD, MI, HTN, DM II, R TKA (2019)    PT Comments  Pt ambulated in hallway and practiced safe stair technique with son, Darina, present and observing.  Pt provided with HEP handout.  Pt and son had no further questions, and pt feels ready for d/c home today.  Pt did report some indigestion, however RN notified.    If plan is discharge home, recommend the following: Assistance with cooking/housework;Assist for transportation;Help with stairs or ramp for entrance   Can travel by private vehicle        Equipment Recommendations  None recommended by PT    Recommendations for Other Services       Precautions / Restrictions Precautions Precautions: Fall;Knee Restrictions Weight Bearing Restrictions Per Provider Order: No LLE Weight Bearing Per Provider Order: Weight bearing as tolerated     Mobility  Bed Mobility Overal bed mobility: Needs Assistance Bed Mobility: Supine to Sit     Supine to sit: Supervision, HOB elevated     General bed mobility comments: verbal cues for technique    Transfers Overall transfer level: Needs assistance Equipment used: Rolling walker (2 wheels) Transfers: Sit to/from Stand Sit to Stand: Contact guard assist           General transfer comment: verbal cues for UE and LE positioning    Ambulation/Gait Ambulation/Gait assistance: Contact guard assist Gait Distance (Feet): 120 Feet Assistive device: Rolling walker (2 wheels) Gait Pattern/deviations: Step-to pattern, Decreased stance time - left, Antalgic       General Gait Details: verbal cues for sequence, step length, RW positioning, posture   Stairs Stairs: Yes Stairs assistance: Contact guard assist Stair Management: Step to pattern,  Forwards, One rail Right, With cane Number of Stairs: 2 General stair comments: verbal cues for sequence and safety, son, Darina present and observed   Wheelchair Mobility     Tilt Bed    Modified Rankin (Stroke Patients Only)       Balance                                            Communication Communication Communication: No apparent difficulties  Cognition Arousal: Alert Behavior During Therapy: WFL for tasks assessed/performed   PT - Cognitive impairments: No apparent impairments                         Following commands: Intact      Cueing    Exercises Total Joint Exercises Ankle Circles/Pumps: AROM, Both, 10 reps Quad Sets: AROM, Both, 10 reps Heel Slides: AAROM, Left, 10 reps Hip ABduction/ADduction: AAROM, Left, 10 reps Straight Leg Raises: AAROM, Left, 10 reps Knee Flexion: AROM, Left, Seated, 10 reps    General Comments        Pertinent Vitals/Pain Pain Assessment Pain Assessment: 0-10 Pain Score: 6  Pain Location: left knee Pain Descriptors / Indicators: Aching, Sore Pain Intervention(s): Monitored during session, Repositioned    Home Living Family/patient expects to be discharged to:: Private residence Living Arrangements: Alone Available Help at Discharge: Family (son staying with pt upon d/c) Type of Home: House Home Access: Stairs to  enter Entrance Stairs-Rails: Right Entrance Stairs-Number of Steps: 2   Home Layout: One level Home Equipment: Agricultural [employer]ant (2 wheels)      Prior Function            PT Goals (current goals can now be found in the care plan section) Acute Rehab PT Goals PT Goal Formulation: With patient Time For Goal Achievement: 07/21/24 Potential to Achieve Goals: Good Progress towards PT goals: Progressing toward goals    Frequency    7X/week      PT Plan      Co-evaluation              AM-PAC PT 6 Clicks Mobility   Outcome Measure  Help needed turning from  your back to your side while in a flat bed without using bedrails?: A Little Help needed moving from lying on your back to sitting on the side of a flat bed without using bedrails?: A Little Help needed moving to and from a bed to a chair (including a wheelchair)?: A Little Help needed standing up from a chair using your arms (e.g., wheelchair or bedside chair)?: A Little Help needed to walk in hospital room?: A Little Help needed climbing 3-5 steps with a railing? : A Little 6 Click Score: 18    End of Session Equipment Utilized During Treatment: Gait belt Activity Tolerance: Patient tolerated treatment well Patient left: with call bell/phone within reach;in bed;with family/visitor present Nurse Communication: Mobility status PT Visit Diagnosis: Difficulty in walking, not elsewhere classified (R26.2)     Time: 1350-1403 PT Time Calculation (min) (ACUTE ONLY): 13 min  Charges:    $Gait Training: 8-22 mins PT General Charges $$ ACUTE PT VISIT: 1 Visit                     Tari KLEIN, DPT Physical Therapist Acute Rehabilitation Services Office: [phone]  Tari CROME Payson 07/14/2024, 2:33 PM

## 2024-07-14 NOTE — Plan of Care (Signed)
   Problem: Coping: Goal: Level of anxiety will decrease Outcome: Progressing   Problem: Pain Managment: Goal: General experience of comfort will improve and/or be controlled Outcome: Progressing   Problem: Safety: Goal: Ability to remain free from injury will improve Outcome: Progressing

## 2024-07-14 NOTE — Evaluation (Signed)
 Physical Therapy Evaluation Patient Details Name: Steven Dixon MRN: [id number] DOB: Nov 22, 1940 Today's Date: 07/14/2024  History of Present Illness  Pt is an 84 year old male s/p L TKA on 07/13/24.  PMHx: afib, arthritis, bradycardia, CKD III, CAD, MI, HTN, DM II, R TKA (2019)  Clinical Impression  Pt is s/p TKA resulting in the deficits listed below (see PT Problem List).  Pt will benefit from acute skilled PT to increase their independence and safety with mobility to allow discharge. Pt able to ambulate in hallway and performed LE exercises.  Will return to ambulate again and practice safe stair technique once son arrives however anticipate pt to return home later today.         If plan is discharge home, recommend the following: Assistance with cooking/housework;Assist for transportation;Help with stairs or ramp for entrance   Can travel by private vehicle        Equipment Recommendations None recommended by PT  Recommendations for Other Services       Functional Status Assessment Patient has had a recent decline in their functional status and demonstrates the ability to make significant improvements in function in a reasonable and predictable amount of time.     Precautions / Restrictions Precautions Precautions: Fall Restrictions Weight Bearing Restrictions Per Provider Order: No LLE Weight Bearing Per Provider Order: Weight bearing as tolerated      Mobility  Bed Mobility Overal bed mobility: Needs Assistance Bed Mobility: Supine to Sit     Supine to sit: Supervision, HOB elevated     General bed mobility comments: verbal cues for technique    Transfers Overall transfer level: Needs assistance Equipment used: Rolling walker (2 wheels) Transfers: Sit to/from Stand Sit to Stand: Contact guard assist           General transfer comment: verbal cues for UE and LE positioning    Ambulation/Gait Ambulation/Gait assistance: Contact guard assist Gait Distance  (Feet): 160 Feet Assistive device: Rolling walker (2 wheels) Gait Pattern/deviations: Step-to pattern, Decreased stance time - left, Antalgic       General Gait Details: verbal cues for sequence, step length, RWpositioning, posture  Stairs            Wheelchair Mobility     Tilt Bed    Modified Rankin (Stroke Patients Only)       Balance                                             Pertinent Vitals/Pain Pain Assessment Pain Assessment: 0-10 Pain Score: 4  Pain Location: left knee Pain Descriptors / Indicators: Aching, Sore Pain Intervention(s): Repositioned, Premedicated before session, Monitored during session, Ice applied    Home Living Family/patient expects to be discharged to:: Private residence Living Arrangements: Alone Available Help at Discharge: Family (son staying with pt upon d/c) Type of Home: House Home Access: Stairs to enter Entrance Stairs-Rails: Right Entrance Stairs-Number of Steps: 2   Home Layout: One level Home Equipment: Agricultural Consultant (2 wheels)      Prior Function Prior Level of Function : Independent/Modified Independent                     Extremity/Trunk Assessment        Lower Extremity Assessment Lower Extremity Assessment: LLE deficits/detail LLE Deficits / Details: L knee AAROM 8-75* ; able to perform  ankle pumps       Communication   Communication Communication: No apparent difficulties    Cognition Arousal: Alert Behavior During Therapy: WFL for tasks assessed/performed   PT - Cognitive impairments: No apparent impairments                         Following commands: Intact       Cueing       General Comments      Exercises Total Joint Exercises Ankle Circles/Pumps: AROM, Both, 10 reps Quad Sets: AROM, Both, 10 reps Heel Slides: AAROM, Left, 10 reps Hip ABduction/ADduction: AAROM, Left, 10 reps Straight Leg Raises: AAROM, Left, 10 reps Knee Flexion: AROM,  Left, Seated, 10 reps   Assessment/Plan    PT Assessment Patient needs continued PT services  PT Problem List Decreased strength;Decreased mobility;Decreased knowledge of use of DME;Decreased range of motion;Pain       PT Treatment Interventions Stair training;Gait training;DME instruction;Therapeutic exercise;Functional mobility training;Therapeutic activities;Patient/family education    PT Goals (Current goals can be found in the Care Plan section)  Acute Rehab PT Goals PT Goal Formulation: With patient Time For Goal Achievement: 07/21/24 Potential to Achieve Goals: Good    Frequency 7X/week     Co-evaluation               AM-PAC PT 6 Clicks Mobility  Outcome Measure Help needed turning from your back to your side while in a flat bed without using bedrails?: A Little Help needed moving from lying on your back to sitting on the side of a flat bed without using bedrails?: A Little Help needed moving to and from a bed to a chair (including a wheelchair)?: A Little Help needed standing up from a chair using your arms (e.g., wheelchair or bedside chair)?: A Little Help needed to walk in hospital room?: A Little Help needed climbing 3-5 steps with a railing? : A Little 6 Click Score: 18    End of Session Equipment Utilized During Treatment: Gait belt Activity Tolerance: Patient tolerated treatment well Patient left: in chair;with call bell/phone within reach   PT Visit Diagnosis: Difficulty in walking, not elsewhere classified (R26.2)    Time: 9045-8986 PT Time Calculation (min) (ACUTE ONLY): 19 min   Charges:   PT Evaluation $PT Eval Low Complexity: 1 Low   PT General Charges $$ ACUTE PT VISIT: 1 Visit        Tari PT, DPT Physical Therapist Acute Rehabilitation Services Office: [phone]   Kati L Payson 07/14/2024, 12:01 PM

## 2024-07-14 NOTE — TOC Transition Note (Signed)
 Transition of Care [facility]) - Discharge Note   Patient Details  Name: Steven Dixon MRN: [id number] Date of Birth: [birth year]  [facility]) CM/SW Contact:  NORMAN ASPEN, LCSW Phone Number: 07/14/2024, 9:36 AM   Clinical Narrative:     Met with pt who confirms he has needed DME in the home.  Pt aware that HHPT prearranged with [facility] prior to surgery.  No further IP CM needs.  Final next level of care: Home w Home Health Services Barriers to Discharge: No Barriers Identified   Patient Goals and CMS Choice Patient states their goals for this hospitalization and ongoing recovery are:: return home          Discharge Placement                       Discharge Plan and Services Additional resources added to the After Visit Summary for                  DME Arranged: N/A DME Agency: NA       HH Arranged: PT HH Agency: [facility]        Social Drivers of Health (SDOH) Interventions SDOH Screenings   Food Insecurity: No Food Insecurity (07/13/2024)  Housing: Low Risk (07/13/2024)  Transportation Needs: No Transportation Needs (07/13/2024)  Utilities: Not At Risk (07/13/2024)  Financial Resource Strain: Medium Risk (01/24/2022)   Received from [facility] visits prior to 09/06/2022., [facility]  Physical Activity: Insufficiently Active (01/24/2022)   Received from [facility] visits prior to 09/06/2022., [facility]  Social Connections: Moderately Integrated (07/13/2024)  Stress: No Stress Concern Present (01/24/2022)   Received from [facility]  Tobacco Use: Medium Risk (07/13/2024)     Readmission Risk Interventions     No data to display

## 2024-07-14 NOTE — Discharge Summary (Signed)
 Physician Discharge Summary  Patient ID: Steven Dixon MRN: [id number] DOB/AGE: May 14, 1941 84 y.o.  Admit date: 07/13/2024 Discharge date: 07/14/2024  Admission Diagnoses:  Primary osteoarthritis of left knee  Discharge Diagnoses:  Principal Problem:   Primary osteoarthritis of left knee   Past Medical History:  Diagnosis Date   A-fib [facility])    Arthritis    Atherosclerotic heart disease of native coronary artery without angina pectoris 06/21/2015   Overview:  Stent to LAD and diagonal 1 in September 2009  Formatting of this note might be different from the original. STEND IN LAD AND D1 IN 2009 Formatting of this note might be different from the original. 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 EF 50-55% on ECHO 05/11/2018 with apical hypokinesis and diastolic dysfunction.   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 03/15/2008   Pre-diabetes    Primary localized osteoarthritis of knee    Right   Primary localized osteoarthritis of right knee 08/14/2017   Type 2 diabetes mellitus (HCC) 01/23/2017   Overview:  Diet controlled    Surgeries: Procedures: ARTHROPLASTY, KNEE, TOTAL on 07/13/2024   Consultants (if any):   Discharged Condition: Improved  Hospital Course: Steven Dixon is an 84 y.o. male who was admitted  07/13/2024 with a diagnosis of Primary osteoarthritis of left knee and went to the operating room on 07/13/2024 and underwent the above named procedures.    He was given perioperative antibiotics:  Anti-infectives (From admission, onward)    Start     Dose/Rate Route Frequency Ordered Stop   07/13/24 2000  ceFAZolin  (ANCEF ) IVPB 2g/100 mL premix        2 g 200 mL/hr over 30 Minutes Intravenous Every 6 hours 07/13/24 1742 07/14/24 0214   07/13/24 1045  ceFAZolin  (ANCEF ) IVPB 2g/100 mL premix        2 g 200 mL/hr over 30 Minutes Intravenous On call to O.R. 07/13/24 1035 07/13/24 1343     .  He was given sequential compression devices, early ambulation, and eliquis  for DVT prophylaxis.  He benefited maximally from the hospital stay and there were no complications.    Recent vital signs:  Vitals:   07/14/24 0148 07/14/24 0522  BP: 134/65 131/61  Pulse: (!) 58 (!) 54  Resp: 16 14  Temp: 98.4 F (36.9 C) 97.6 F (36.4 C)  SpO2: 95% 95%    Recent laboratory studies:  Lab Results  Component Value Date   HGB 13.3 07/14/2024   HGB 14.4 07/04/2024   HGB 14.2 09/16/2021   Lab Results  Component Value Date   WBC 10.3 07/14/2024   PLT 251 07/14/2024   Lab Results  Component Value Date   INR 0.99 08/03/2017   Lab Results  Component Value Date   NA 133 (L) 07/14/2024  K 4.6 07/14/2024   CL 99 07/14/2024   CO2 21 (L) 07/14/2024   BUN 31 (H) 07/14/2024   CREATININE 1.86 (H) 07/14/2024   GLUCOSE 250 (H) 07/14/2024    Discharge Medications:   Allergies as of 07/14/2024       Reactions   Latex Swelling   Sacubitril-valsartan Other (See Comments)   Insomnia   Tape Dermatitis        Medication List     TAKE these medications    acetaminophen  500 MG tablet Commonly known as: TYLENOL  Take 2 tablets (1,000 mg total) by mouth every 8 (eight) hours as needed.   amLODipine  5 MG tablet Commonly known as: NORVASC  Take 7.5 mg by mouth in the morning.   apixaban  2.5 MG  Tabs tablet Commonly known as: Eliquis  Take 1 tablet (2.5 mg total) by mouth 2 (two) times daily.   atorvastatin  20 MG tablet Commonly known as: LIPITOR Take 1 tablet (20 mg total) by mouth daily. What changed: when to take this   carvedilol  3.125 MG tablet Commonly known as: COREG  Take 1 tablet (3.125 mg total) by mouth 2 (two) times daily. What changed:  how much to take when to take this   cholecalciferol  25 MCG (1000 UNIT) tablet Commonly known as: VITAMIN D3 Take 1,000 Units by mouth every other day.   Coenzyme Q10 300 MG Caps Take 300 mg by mouth daily in the afternoon.   empagliflozin  25 MG Tabs tablet Commonly known as: JARDIANCE  Take 25 mg by mouth in the morning.   ezetimibe  10 MG tablet Commonly known as: ZETIA  TAKE 1 TABLET BY MOUTH EVERY DAY   glipiZIDE  10 MG 24 hr tablet Commonly known as: GLUCOTROL  XL Take 10 mg by mouth daily.   Lubricant Eye Drops 0.4-0.3 % Soln Generic drug: Polyethyl Glycol-Propyl Glycol Place 1-2 drops into both eyes 3 (three) times daily as needed (dry/irritated eyes.).   methocarbamol  500 MG tablet Commonly known as: ROBAXIN  Take 1 tablet (500 mg total) by mouth every 8 (eight) hours as needed for up to 10 days for muscle spasms.   nitroGLYCERIN  0.4 MG SL tablet Commonly known as: NITROSTAT  Place 0.4 mg under the tongue every 5 (five) minutes x 3 doses as needed for chest pain.   omeprazole  40 MG capsule Commonly known as: PRILOSEC Take 1 capsule (40 mg total) by mouth daily.   ondansetron  4 MG tablet Commonly known as: Zofran  Take 1 tablet (4 mg total) by mouth every 8 (eight) hours as needed for up to 14 days for nausea or vomiting.   oxyCODONE  5 MG immediate release tablet Commonly known as: Roxicodone  Take 1 tablet (5 mg total) by mouth every 4 (four) hours as needed for up to 7 days for severe pain (pain score 7-10) or moderate pain (pain score 4-6).   polyethylene glycol 17 g packet Commonly known as:  MiraLax  Take 17 g by mouth daily.   Semglee  (yfgn) 100 UNIT/ML Pen Generic drug: insulin  glargine-yfgn Inject 17 Units into the skin at bedtime.   tamsulosin  0.4 MG Caps capsule Commonly known as: FLOMAX  Take 0.4 mg by mouth every evening.   valsartan 80 MG tablet Commonly known as: DIOVAN Take 80 mg by mouth daily in the afternoon.   zinc sulfate (50mg  elemental zinc) 220 (50 Zn) MG capsule Take 220 mg by mouth every other day.        Diagnostic Studies: DG Knee Left Port Result Date: 07/13/2024 CLINICAL DATA:  [id number] Post-operative state [id number]  EXAM: PORTABLE LEFT KNEE - 1-2 VIEW COMPARISON:  None Available. FINDINGS: Status post knee arthroplasty. Orthopedic hardware is intact and without periprosthetic fracture or lucency. Soft tissue air. Vascular calcifications. IMPRESSION: Expected postsurgical appearance status post knee arthroplasty Electronically Signed   By: Corean Salter M.D.   On: 07/13/2024 16:55    Disposition: Discharge disposition: 01-Home or Self Care       Discharge Instructions     Call MD / Call 911   Complete by: As directed    If you experience chest pain or shortness of breath, CALL 911 and be transported to the hospital emergency room.  If you develope a fever above 101 F, pus (white drainage) or increased drainage or redness at the wound, or calf pain, call your surgeon's office.   Constipation Prevention   Complete by: As directed    Drink plenty of fluids.  Prune juice may be helpful.  You may use a stool softener, such as Colace (over the counter) 100 mg twice a day.  Use MiraLax  (over the counter) for constipation as needed.   Increase activity slowly as tolerated   Complete by: As directed    Post-operative opioid taper instructions:   Complete by: As directed    POST-OPERATIVE OPIOID TAPER INSTRUCTIONS: It is important to wean off of your opioid medication as soon as possible. If you do not need pain medication after your surgery it is  ok to stop day one. Opioids include: Codeine, Hydrocodone (Norco, Vicodin), Oxycodone (Percocet, oxycontin ) and hydromorphone  amongst others.  Long term and even short term use of opiods can cause: Increased pain response Dependence Constipation Depression Respiratory depression And more.  Withdrawal symptoms can include Flu like symptoms Nausea, vomiting And more Techniques to manage these symptoms Hydrate well Eat regular healthy meals Stay active Use relaxation techniques(deep breathing, meditating, yoga) Do Not substitute Alcohol  to help with tapering If you have been on opioids for less than two weeks and do not have pain than it is ok to stop all together.  Plan to wean off of opioids This plan should start within one week post op of your joint replacement. Maintain the same interval or time between taking each dose and first decrease the dose.  Cut the total daily intake of opioids by one tablet each day Next start to increase the time between doses. The last dose that should be eliminated is the evening dose.           Follow-up Information     Edna Toribio LABOR, MD Follow up.   Specialty: Orthopedic Surgery Contact information: [address] [place] [address] [phone]                    Discharge Instructions      INSTRUCTIONS AFTER JOINT REPLACEMENT   Remove items at home which could result in a fall. This includes throw rugs or furniture in walking pathways ICE to the affected joint every three hours while awake for 30 minutes at a time, for at least the first 3-5 days, and then as needed for pain and swelling.  Continue to use ice for pain and swelling. You may notice swelling that will progress down to the foot and ankle.  This is normal after surgery.  Elevate your leg when you are not up walking on it.   Continue to use the breathing machine you got in the hospital (incentive spirometer) which will help keep your  temperature down.  It  is common for your temperature to cycle up and down following surgery, especially at night when you are not up moving around and exerting yourself.  The breathing machine keeps your lungs expanded and your temperature down.  DIET:  As you were doing prior to hospitalization, we recommend a well-balanced diet.  DRESSING / WOUND CARE / SHOWERING:  Keep the surgical dressing until follow up.  The dressing is water  proof, so you can shower without any extra covering.  IF THE DRESSING FALLS OFF or the wound gets wet inside, change the dressing with sterile gauze.  Please use good hand washing techniques before changing the dressing.  Do not use any lotions or creams on the incision until instructed by your surgeon.    ACTIVITY  Increase activity slowly as tolerated, but follow the weight bearing instructions below.   No driving for 6 weeks or until further direction given by your physician.  You cannot drive while taking narcotics.  No lifting or carrying greater than 10 lbs. until further directed by your surgeon. Avoid periods of inactivity such as sitting longer than an hour when not asleep. This helps prevent blood clots.  You may return to work once you are authorized by your doctor.   WEIGHT BEARING: Weight bearing as tolerated with assist device (walker, cane, etc) as directed, use it as long as suggested by your surgeon or therapist, typically at least 4-6 weeks.  EXERCISES  Results after joint replacement surgery are often greatly improved when you follow the exercise, range of motion and muscle strengthening exercises prescribed by your doctor. Safety measures are also important to protect the joint from further injury. Any time any of these exercises cause you to have increased pain or swelling, decrease what you are doing until you are comfortable again and then slowly increase them. If you have problems or questions, call your caregiver or physical therapist for  advice.   Rehabilitation is important following a joint replacement. After just a few days of immobilization, the muscles of the leg can become weakened and shrink (atrophy).  These exercises are designed to build up the tone and strength of the thigh and leg muscles and to improve motion. Often times heat used for twenty to thirty minutes before working out will loosen up your tissues and help with improving the range of motion but do not use heat for the first two weeks following surgery (sometimes heat can increase post-operative swelling).   These exercises can be done on a training (exercise) mat, on the floor, on a table or on a bed. Use whatever works the best and is most comfortable for you.    Use music or television while you are exercising so that the exercises are a pleasant break in your day. This will make your life better with the exercises acting as a break in your routine that you can look forward to.   Perform all exercises about fifteen times, three times per day or as directed.  You should exercise both the operative leg and the other leg as well.  Exercises include:   Quad Sets - Tighten up the muscle on the front of the thigh (Quad) and hold for 5-10 seconds.   Straight Leg Raises - With your knee straight (if you were given a brace, keep it on), lift the leg to 60 degrees, hold for 3 seconds, and slowly lower the leg.  Perform this exercise against resistance later as your leg gets stronger.  Leg Slides: Lying  on your back, slowly slide your foot toward your buttocks, bending your knee up off the floor (only go as far as is comfortable). Then slowly slide your foot back down until your leg is flat on the floor again.  Angel Wings: Lying on your back spread your legs to the side as far apart as you can without causing discomfort.  Hamstring Strength:  Lying on your back, push your heel against the floor with your leg straight by tightening up the muscles of your buttocks.  Repeat,  but this time bend your knee to a comfortable angle, and push your heel against the floor.  You may put a pillow under the heel to make it more comfortable if necessary.   A rehabilitation program following joint replacement surgery can speed recovery and prevent re-injury in the future due to weakened muscles. Contact your doctor or a physical therapist for more information on knee rehabilitation.   CONSTIPATION:  Constipation is defined medically as fewer than three stools per week and severe constipation as less than one stool per week.  Even if you have a regular bowel pattern at home, your normal regimen is likely to be disrupted due to multiple reasons following surgery.  Combination of anesthesia, postoperative narcotics, change in appetite and fluid intake all can affect your bowels.   YOU MUST use at least one of the following options; they are listed in order of increasing strength to get the job done.  They are all available over the counter, and you may need to use some, POSSIBLY even all of these options:    Drink plenty of fluids (prune juice may be helpful) and high fiber foods Colace 100 mg by mouth twice a day  Senokot for constipation as directed and as needed Dulcolax (bisacodyl), take with full glass of water   Miralax  (polyethylene glycol) once or twice a day as needed.  If you have tried all these things and are unable to have a bowel movement in the first 3-4 days after surgery call either your surgeon or your primary doctor.    If you experience loose stools or diarrhea, hold the medications until you stool forms back up.  If your symptoms do not get better within 1 week or if they get worse, check with your doctor.  If you experience the worst abdominal pain ever or develop nausea or vomiting, please contact the office immediately for further recommendations for treatment.  ITCHING:  If you experience itching with your medications, try taking only a single pain pill, or  even half a pain pill at a time.  You can also use Benadryl  over the counter for itching or also to help with sleep.   TED HOSE STOCKINGS:  Use stockings on both legs until for at least 2 weeks or as directed by physician office. They may be removed at night for sleeping.  MEDICATIONS:  See your medication summary on the After Visit Summary that nursing will review with you.  You may have some home medications which will be placed on hold until you complete the course of blood thinner medication.  It is important for you to complete the blood thinner medication as prescribed.  Blood clot prevention (DVT Prophylaxis): After surgery you are at an increased risk for a blood clot.  You are to resume your home eliquis  2.5mg , to be taken twice daily for a total of 4 weeks from surgery to help reduce your risk of getting a blood clot.  Then resume  how your regular prescriber has instructed.  Signs of a pulmonary embolus (blood clot in the lungs) include sudden short of breath, feeling lightheaded or dizzy, chest pain with a deep breath, rapid pulse rapid breathing.  Signs of a blood clot in your arms or legs include new unexplained swelling and cramping, warm, red or darkened skin around the painful area.  Please call the office or 911 right away if these signs or symptoms develop.  PRECAUTIONS:   If you experience chest pain or shortness of breath - call 911 immediately for transfer to the hospital emergency department.   If you develop a fever greater that 101 F, purulent drainage from wound, increased redness or drainage from wound, foul odor from the wound/dressing, or calf pain - CONTACT YOUR SURGEON.                                                   FOLLOW-UP APPOINTMENTS:  If you do not already have a post-op appointment, please call the office for an appointment to be seen by your surgeon.  Guidelines for how soon to be seen are listed in your After Visit Summary, but are typically between 2-3 weeks  after surgery.  If you have a specialized bandage, you may be told to follow up 1 week after surgery.  OTHER INSTRUCTIONS:  Knee Replacement:  Do not place pillow under knee, focus on keeping the knee straight while resting.  Place foam block, curve side up under heel at all times except when walking.  DO NOT modify, tear, cut, or change the foam block in any way.  POST-OPERATIVE OPIOID TAPER INSTRUCTIONS: It is important to wean off of your opioid medication as soon as possible. If you do not need pain medication after your surgery it is ok to stop day one. Opioids include: Codeine, Hydrocodone (Norco, Vicodin), Oxycodone (Percocet, oxycontin ) and hydromorphone  amongst others.  Long term and even short term use of opiods can cause: Increased pain response Dependence Constipation Depression Respiratory depression And more.  Withdrawal symptoms can include Flu like symptoms Nausea, vomiting And more Techniques to manage these symptoms Hydrate well Eat regular healthy meals Stay active Use relaxation techniques(deep breathing, meditating, yoga) Do Not substitute Alcohol  to help with tapering If you have been on opioids for less than two weeks and do not have pain than it is ok to stop all together.  Plan to wean off of opioids This plan should start within one week post op of your joint replacement. Maintain the same interval or time between taking each dose and first decrease the dose.  Cut the total daily intake of opioids by one tablet each day Next start to increase the time between doses. The last dose that should be eliminated is the evening dose.   MAKE SURE YOU:  Understand these instructions.  Get help right away if you are not doing well or get worse.    Thank you for letting us  be a part of your medical care team.  It is a privilege we respect greatly.  We hope these instructions will help you stay on track for a fast and full recovery!             Signed: Sixto Bowdish A Tyara Dassow 07/14/2024, 6:42 AM

## 2024-07-15 NOTE — Progress Notes (Signed)
 [facility] Transitional Care Management     Situation   Steven Dixon is a 84 y.o. male who was contacted today for a transitional care outreach.  Admission Date: 07/13/24 Discharge Date: 07/14/24   Institution: [facility]   Diagnosis:  LEFT total knee arthroplasty  Is this visit eligible for TCM? Yes  Background  First Call:  Successful Successful:  Talked with patient First call date:  07/15/2024  Since Discharge: HN spoke to pt and the following is reported: - Pt denies new or worsening sx since discharge. Pt reports HH PT will start today. Pt denies fever, worsening pain/redness/drainage, chest pain, sob, dizziness, weakness. HN educated pt regarding pain, constipation concerns, abn s/sx post surgery. Pt reports post op appt 07/26/24. Pt reports his family will pick up meds this AM    Ordered Prescriptions Prescription Sig Dispense Quantity Refills Last Filled Start Date End Date  omeprazole  (PRILOSEC) 40 MG capsule  Take 1 capsule (40 mg total) by mouth daily. 21 capsule      07/13/2024 08/03/2024  polyethylene glycol (MIRALAX ) 17 g packet  Take 17 g by mouth daily. 14 each      07/13/2024    oxyCODONE  (ROXICODONE ) 5 MG immediate release tablet  Take 1 tablet (5 mg total) by mouth every 4 (four) hours as needed for up to 7 days for severe pain (pain score 7-10) or moderate pain (pain score 4-6). 40 tablet      07/13/2024 07/20/2024  methocarbamol  (ROBAXIN ) 500 MG tablet  Take 1 tablet (500 mg total) by mouth every 8 (eight) hours as needed for up to 10 days for muscle spasms. 30 tablet      07/13/2024 07/23/2024  acetaminophen  (TYLENOL ) 500 MG tablet  Take 2 tablets (1,000 mg total) by mouth every 8 (eight) hours as needed.       07/13/2024 08/12/2024  ondansetron  (ZOFRAN ) 4 MG tablet  Take 1 tablet (4 mg total) by mouth every 8 (eight) hours as needed for up to 14 days for nausea or vomiting. 14 tablet      07/13/2024 07/27/2024   Follow-up Information  Edna Toribio LABOR, MD Follow up.  Specialty: Orthopedic Surgery Contact information: [address] [place] [address] [phone]   Primary Care Provider on Record: Powell CHRISTELLA [place], MD   Assessment    General Assessment     None       List of Assessments:  General Assessment:  What type of visit: Telephone   Assessment completed with: Patient Was an interpreter used?: No Language Preferred: English      Recommendation    PCP/specialist notified: Yes  Referral Made: No  Referrals made to other disciplines: None   Future Appointments  Date Time Provider Department Center  07/21/2024 10:00 AM Morene Eck Roddy, FNP [facility] [address]  08/25/2024  9:45 AM [facility] [facility] [address]  09/01/2024  8:45 AM Powell CHRISTELLA [place], MD [facility] [address]  06/07/2025 10:15 AM [place] Beverlee GAILS, MD [facility] None     Collier Mulch, RN  [facility] Navigator [phone]    Electronically signed by: Collier Stacia Mulch, RN 07/15/2024 9:11 AM

## 2024-07-20 ENCOUNTER — Encounter (HOSPITAL_COMMUNITY): Payer: Self-pay | Admitting: Orthopedic Surgery

## 2024-07-21 ENCOUNTER — Ambulatory Visit
